# Patient Record
Sex: Male | Born: 1995 | Race: White | Hispanic: No | Marital: Single | Smoking: Never smoker
Health system: Southern US, Community
[De-identification: ages and names within clinical notes are randomized; demographics above are authoritative.]

---

## 2015-11-06 ENCOUNTER — Encounter: Payer: Self-pay | Admitting: Internal Medicine

## 2015-11-06 ENCOUNTER — Emergency Department: Payer: BLUE CROSS/BLUE SHIELD

## 2015-11-06 ENCOUNTER — Inpatient Hospital Stay: Payer: BLUE CROSS/BLUE SHIELD

## 2015-11-06 ENCOUNTER — Inpatient Hospital Stay
Admission: EM | Admit: 2015-11-06 | Discharge: 2015-11-09 | DRG: 917 | Disposition: A | Discharge status: Home / Self Care | Payer: BLUE CROSS/BLUE SHIELD | Attending: Internal Medicine | Admitting: Internal Medicine

## 2015-11-06 DIAGNOSIS — G92 Toxic encephalopathy: Secondary | ICD-10-CM | POA: Diagnosis present

## 2015-11-06 DIAGNOSIS — J9811 Atelectasis: Secondary | ICD-10-CM | POA: Diagnosis present

## 2015-11-06 DIAGNOSIS — J9600 Acute respiratory failure, unspecified whether with hypoxia or hypercapnia: Secondary | ICD-10-CM | POA: Diagnosis present

## 2015-11-06 DIAGNOSIS — R402432 Glasgow coma scale score 3-8, at arrival to emergency department: Secondary | ICD-10-CM

## 2015-11-06 DIAGNOSIS — Y929 Unspecified place or not applicable: Secondary | ICD-10-CM | POA: Diagnosis not present

## 2015-11-06 DIAGNOSIS — G312 Degeneration of nervous system due to alcohol: Secondary | ICD-10-CM | POA: Diagnosis present

## 2015-11-06 DIAGNOSIS — F10129 Alcohol abuse with intoxication, unspecified: Secondary | ICD-10-CM | POA: Diagnosis present

## 2015-11-06 DIAGNOSIS — T5191XA Toxic effect of unspecified alcohol, accidental (unintentional), initial encounter: Secondary | ICD-10-CM

## 2015-11-06 DIAGNOSIS — T510X1A Toxic effect of ethanol, accidental (unintentional), initial encounter: Principal | ICD-10-CM | POA: Diagnosis present

## 2015-11-06 DIAGNOSIS — R509 Fever, unspecified: Secondary | ICD-10-CM

## 2015-11-06 DIAGNOSIS — J9690 Respiratory failure, unspecified, unspecified whether with hypoxia or hypercapnia: Secondary | ICD-10-CM

## 2015-11-06 DIAGNOSIS — T5191XS Toxic effect of unspecified alcohol, accidental (unintentional), sequela: Secondary | ICD-10-CM | POA: Diagnosis not present

## 2015-11-06 DIAGNOSIS — R404 Transient alteration of awareness: Secondary | ICD-10-CM | POA: Diagnosis not present

## 2015-11-06 DIAGNOSIS — R4189 Other symptoms and signs involving cognitive functions and awareness: Secondary | ICD-10-CM

## 2015-11-06 LAB — URINE DRUG SCREEN, QUALITATIVE (ARMC ONLY)
AMPHETAMINES, UR SCREEN: NOT DETECTED
BENZODIAZEPINE, UR SCRN: NOT DETECTED
Barbiturates, Ur Screen: NOT DETECTED
CANNABINOID 50 NG, UR ~~LOC~~: NOT DETECTED
Cocaine Metabolite,Ur ~~LOC~~: NOT DETECTED
MDMA (Ecstasy)Ur Screen: NOT DETECTED
Methadone Scn, Ur: NOT DETECTED
OPIATE, UR SCREEN: NOT DETECTED
PHENCYCLIDINE (PCP) UR S: NOT DETECTED
Tricyclic, Ur Screen: NOT DETECTED

## 2015-11-06 LAB — CBC WITH DIFFERENTIAL/PLATELET
Basophils Absolute: 0 K/uL (ref 0–0.1)
Basophils Relative: 0 %
Eosinophils Absolute: 0.1 K/uL (ref 0–0.7)
Eosinophils Relative: 1 %
HCT: 54 % — ABNORMAL HIGH (ref 40.0–52.0)
Hemoglobin: 17.9 g/dL (ref 13.0–18.0)
Lymphocytes Relative: 25 %
Lymphs Abs: 2.3 K/uL (ref 1.0–3.6)
MCH: 30.6 pg (ref 26.0–34.0)
MCHC: 33.2 g/dL (ref 32.0–36.0)
MCV: 92.1 fL (ref 80.0–100.0)
Monocytes Absolute: 0.7 K/uL (ref 0.2–1.0)
Monocytes Relative: 7 %
Neutro Abs: 6.2 K/uL (ref 1.4–6.5)
Neutrophils Relative %: 67 %
Platelets: 273 K/uL (ref 150–440)
RBC: 5.86 MIL/uL (ref 4.40–5.90)
RDW: 12.5 % (ref 11.5–14.5)
WBC: 9.3 K/uL (ref 3.8–10.6)

## 2015-11-06 LAB — ACETAMINOPHEN LEVEL: Acetaminophen (Tylenol), Serum: <10 ug/mL — ABNORMAL LOW (ref 10–30)

## 2015-11-06 LAB — URINALYSIS COMPLETE WITH MICROSCOPIC (ARMC ONLY)
Bacteria, UA: NONE SEEN
Bilirubin Urine: NEGATIVE
Glucose, UA: NEGATIVE mg/dL
Hgb urine dipstick: NEGATIVE
Ketones, ur: NEGATIVE mg/dL
Leukocytes, UA: NEGATIVE
Nitrite: NEGATIVE
Protein, ur: NEGATIVE mg/dL
RBC / HPF: NONE SEEN RBC/hpf (ref 0–5)
Specific Gravity, Urine: 1.002 — ABNORMAL LOW (ref 1.005–1.030)
Squamous Epithelial / HPF: NONE SEEN
WBC, UA: NONE SEEN WBC/hpf (ref 0–5)
pH: 6 (ref 5.0–8.0)

## 2015-11-06 LAB — COMPREHENSIVE METABOLIC PANEL
ALBUMIN: 5.2 g/dL — AB (ref 3.5–5.0)
ALT: 27 U/L (ref 17–63)
AST: 25 U/L (ref 15–41)
Alkaline Phosphatase: 82 U/L (ref 38–126)
Anion gap: 9 (ref 5–15)
BILIRUBIN TOTAL: 0.6 mg/dL (ref 0.3–1.2)
BUN: 12 mg/dL (ref 6–20)
CALCIUM: 9 mg/dL (ref 8.9–10.3)
CO2: 28 mmol/L (ref 22–32)
Chloride: 104 mmol/L (ref 101–111)
Creatinine, Ser: 0.95 mg/dL (ref 0.61–1.24)
GFR calc Af Amer: >60 mL/min (ref >60)
GFR calc non Af Amer: >60 mL/min (ref >60)
GLUCOSE: 99 mg/dL (ref 65–99)
POTASSIUM: 3.5 mmol/L (ref 3.5–5.1)
SODIUM: 141 mmol/L (ref 135–145)
TOTAL PROTEIN: 8.7 g/dL — AB (ref 6.5–8.1)

## 2015-11-06 LAB — ETHANOL: Alcohol, Ethyl (B): 417 mg/dL (ref <5)

## 2015-11-06 LAB — LACTIC ACID, PLASMA
Lactic Acid, Venous: 2.4 mmol/L (ref 0.5–2.0)
Lactic Acid, Venous: 2.3 mmol/L (ref 0.5–2.0)

## 2015-11-06 LAB — SALICYLATE LEVEL

## 2015-11-06 LAB — MRSA PCR SCREENING: MRSA by PCR: NEGATIVE

## 2015-11-06 LAB — GLUCOSE, CAPILLARY: Glucose-Capillary: 119 mg/dL — ABNORMAL HIGH (ref 65–99)

## 2015-11-06 MED ORDER — SUCCINYLCHOLINE CHLORIDE 20 MG/ML IJ SOLN
INTRAMUSCULAR | Status: AC | PRN
  Administered 2015-11-06: 100 mg via INTRAVENOUS

## 2015-11-06 MED ORDER — ONDANSETRON HCL 4 MG PO TABS: 4.0000 mg | ORAL_TABLET | Freq: Four times a day (QID) | ORAL | Status: DC | PRN

## 2015-11-06 MED ORDER — ANTISEPTIC ORAL RINSE SOLUTION (CORINZ)
7.0000 mL | Freq: Four times a day (QID) | OROMUCOSAL | Status: DC
  Administered 2015-11-07 (×2): 7 mL via OROMUCOSAL
  Filled 2015-11-06 (×6): qty 7

## 2015-11-06 MED ORDER — CHLORHEXIDINE GLUCONATE 0.12% ORAL RINSE (MEDLINE KIT)
15.0000 mL | Freq: Two times a day (BID) | OROMUCOSAL | Status: DC
  Administered 2015-11-06 – 2015-11-07 (×2): 15 mL via OROMUCOSAL
  Filled 2015-11-06 (×4): qty 15

## 2015-11-06 MED ORDER — SODIUM CHLORIDE 0.9 % IJ SOLN
3.0000 mL | Freq: Two times a day (BID) | INTRAMUSCULAR | Status: DC
Start: 2015-11-06 — End: 2015-11-09
  Administered 2015-11-06 – 2015-11-07 (×3): 3 mL via INTRAVENOUS

## 2015-11-06 MED ORDER — ONDANSETRON HCL 4 MG/2ML IJ SOLN
4.0000 mg | Freq: Four times a day (QID) | INTRAMUSCULAR | Status: DC | PRN
Start: 2015-11-06 — End: 2015-11-09

## 2015-11-06 MED ORDER — PANTOPRAZOLE SODIUM 40 MG IV SOLR
40.0000 mg | Freq: Two times a day (BID) | INTRAVENOUS | Status: DC
  Administered 2015-11-06 – 2015-11-07 (×3): 40 mg via INTRAVENOUS
  Filled 2015-11-06 (×4): qty 40

## 2015-11-06 MED ORDER — LORAZEPAM 2 MG/ML IJ SOLN
1.0000 mg | INTRAMUSCULAR | Status: DC | PRN
  Administered 2015-11-06 – 2015-11-07 (×2): 1 mg via INTRAVENOUS
  Filled 2015-11-06 (×2): qty 1

## 2015-11-06 MED ORDER — DEXTROSE 5 % IV SOLN
1.0000 g | INTRAVENOUS | Status: DC
  Administered 2015-11-06: 1 g via INTRAVENOUS
  Filled 2015-11-06 (×2): qty 10

## 2015-11-06 MED ORDER — KCL IN DEXTROSE-NACL 20-5-0.45 MEQ/L-%-% IV SOLN
INTRAVENOUS | Status: DC
  Administered 2015-11-06 – 2015-11-07 (×2): via INTRAVENOUS
  Filled 2015-11-06 (×5): qty 1000

## 2015-11-06 MED ORDER — MORPHINE SULFATE (PF) 2 MG/ML IV SOLN: 2.0000 mg | INTRAVENOUS | Status: DC | PRN

## 2015-11-06 MED ORDER — LORAZEPAM 2 MG/ML IJ SOLN
INTRAMUSCULAR | Status: AC
  Filled 2015-11-06: qty 1

## 2015-11-06 MED ORDER — ETOMIDATE 2 MG/ML IV SOLN
INTRAVENOUS | Status: AC | PRN
  Administered 2015-11-06: 20 mg via INTRAVENOUS

## 2015-11-06 MED ORDER — MIDAZOLAM HCL 2 MG/2ML IJ SOLN
2.0000 mg | INTRAMUSCULAR | Status: DC | PRN
  Administered 2015-11-06: 2 mg via INTRAVENOUS
  Filled 2015-11-06: qty 2

## 2015-11-06 MED ORDER — HEPARIN SODIUM (PORCINE) 5000 UNIT/ML IJ SOLN
5000.0000 [IU] | Freq: Three times a day (TID) | INTRAMUSCULAR | Status: DC
  Administered 2015-11-06 – 2015-11-07 (×2): 5000 [IU] via SUBCUTANEOUS
  Filled 2015-11-06 (×2): qty 1

## 2015-11-06 NOTE — ED Notes (Signed)
Pt unresponsive and unable to give any info.

## 2015-11-06 NOTE — Progress Notes (Signed)
ETT advanced to 25cm. Pt tolerated fairly well. Coughing. Reinflated cuff and secured ETT to tube holder.

## 2015-11-06 NOTE — ED Provider Notes (Signed)
Time Seen: Approximately patient was seen on arrival  I have reviewed the triage notes  Chief Complaint: Ingestion   History of Present Illness: Richard Fleming is a 19 y.o. male who was transported here by EMS with a history of having an altered mental status. Limited history based on EMS and interviewing the bystanders at the scene. I have no bystanders here to interview during initial assessment. The patient arrives obtunded and quick bedside exam shows no gag reflex at this point he does not respond to any painful stimuli. Pupils are 4-2 mm bilateral reactive. The patient had received some Narcan prior to arrival. No significant change in his mental status. He noted that he had vomited on his face and nose and a flat on his back when they arrived.   History reviewed. No pertinent past medical history.  Patient Active Problem List   Diagnosis Date Noted  . Unresponsive state 11/06/2015  . Alcohol causing toxic effect 11/06/2015    History reviewed. No pertinent past surgical history.  History reviewed. No pertinent past surgical history.  Current Outpatient Rx  Name  Route  Sig  Dispense  Refill  . ACANYA gel   Topical   Apply 1 application topically every morning. Apply sparingly to face.      0     Dispense as written.   . Sulfacetamide Sodium, Acne, 10 % LOTN   Topical   Apply 1 application topically at bedtime. Apply to affected area on top of tazorac.      2   . TAZORAC 0.05 % cream   Topical   Apply 1 application topically every evening. Apply to affected area      0     Dispense as written.     Allergies:  Review of patient's allergies indicates not on file.  Family History: History reviewed. No pertinent family history.  Social History: Social History  Substance Use Topics  . Smoking status: Unknown If Ever Smoked  . Smokeless tobacco: None  . Alcohol Use: None     Review of Systems:  We're unable to obtain any review of  systems.  Physical Exam:  ED Triage Vitals  Enc Vitals Group     BP 11/06/15 1500 121/78 mmHg     Pulse Rate 11/06/15 1500 69     Resp 11/06/15 1500 12     Temp 11/06/15 1530 95.7 F (35.4 C)     Temp Source 11/06/15 1804 Core     SpO2 11/06/15 1500 99 %     Weight 11/06/15 1507 171 lb (77.565 kg)     Height 11/06/15 1507  (1.727 m)     Head Cir --      Peak Flow --      Pain Score --      Pain Loc --      Pain Edu? --      Excl. in GC? --     General: Patient is a Glasgow Coma Scale of 5 he is nonverbal and has downgoing Babinski with some mild retraction noted decorticate ordered the cerebral activity Head: Normal cephalic , atraumatic Eyes: Pupils equal , round, reactive to light Nose/Throat: No nasal drainage, patent upper airway without erythema or exudate. Patient has what appears to be some dried emesis at the nares and around his lower part of his mouth is no complaint of fluids in the oral cavity. With tongue depressor applied to the back of his throat he has a minimal  gag reflex Neck: Supple, Full range of motion, No anterior adenopathy or palpable thyroid masses Lungs: Clear to ascultation with diminished breath sounds at the right base without wheezes or rhonchi Heart: Regular rate, regular rhythm without murmurs , gallops , or rubs Abdomen: Soft, non tender without rebound, guarding , or rigidity; bowel sounds positive and symmetric in all 4 quadrants. No organomegaly .        Extremities: 2 plus symmetric pulses. No edema, clubbing or cyanosis Neurologic: GCS less than 8 Skin: warm, dry, no rashes mild diffuse erythematous rash that appears hive-like in nature. No edema   Labs:   All laboratory work was reviewed including any pertinent negatives or positives listed below:  Labs Reviewed  CBC WITH DIFFERENTIAL/PLATELET - Abnormal; Notable for the following:    HCT 54.0 (*)    All other components within normal limits  COMPREHENSIVE METABOLIC PANEL -  Abnormal; Notable for the following:    Total Protein 8.7 (*)    Albumin 5.2 (*)    All other components within normal limits  URINALYSIS COMPLETEWITH MICROSCOPIC ([facility] ONLY) - Abnormal; Notable for the following:    Color, Urine COLORLESS (*)    APPearance CLEAR (*)    Specific Gravity, Urine 1.002 (*)    All other components within normal limits  LACTIC ACID, PLASMA - Abnormal; Notable for the following:    Lactic Acid, Venous 2.4 (*)    All other components within normal limits  ETHANOL - Abnormal; Notable for the following:    Alcohol, Ethyl (B) 417 (*)    All other components within normal limits  ACETAMINOPHEN LEVEL - Abnormal; Notable for the following:    Acetaminophen (Tylenol), Serum <10 (*)    All other components within normal limits  BLOOD GAS, ARTERIAL - Abnormal; Notable for the following:    pO2, Arterial 162 (*)    Allens test (pass/fail) POSITIVE (*)    All other components within normal limits  URINE DRUG SCREEN, QUALITATIVE ([facility] ONLY)  SALICYLATE LEVEL  LACTIC ACID, PLASMA  ETHANOL    EKG:  ED ECG REPORT I, Jennye MoccasinBrian S Quigley, the attending physician, personally viewed and interpreted this ECG.  Date: 11/06/2015 EKG Time: 1458 Rate: 75 Rhythm: normal sinus rhythm QRS Axis: normal Intervals: normal ST/T Wave abnormalities: normal Conduction Disutrbances: none Narrative Interpretation: unremarkable    Radiology:  EXAM: CT HEAD WITHOUT CONTRAST  TECHNIQUE: Contiguous axial images were obtained from the base of the skull through the vertex without intravenous contrast.  COMPARISON: None.  FINDINGS: Ventricles and cisterns are within normal. There is no mass, shift of midline structures or acute hemorrhage. No evidence of acute infarction. 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 Bony structures are within normal.  IMPRESSION: No acute intracranial findings.   Electronically Signed By: Elberta Fortisaniel Boyle M.D. On: 11/06/2015  17:17          DG Chest Port 1 View (Final result) Result time: 11/06/15 16:12:07   Final result by Rad Results In Interface (11/06/15 16:12:07)   Narrative:   CLINICAL DATA: NG tube placement. Found unresponsive and warm room today.  EXAM: PORTABLE CHEST 1 VIEW  COMPARISON: None.  FINDINGS: Endotracheal tube with tip 7.7 cm above the carina. Enteric tube courses into the stomach with tip and side port within the stomach in the left upper quadrant.  Lungs are adequately inflated without consolidation or effusion. Cardiomediastinal silhouette is within normal. Remaining bones and soft tissues are normal.  IMPRESSION: No acute cardiopulmonary disease.  Tubes and lines as described.        I personally reviewed the radiologic studies   Procedures: * Rapid sequence intubation Patient required airway management due to the risk of aspiration with significant altered mental status with a Glasgow Coma Scale 5. After IV was obtained patient was placed on a continuous cardiac monitor. Patient received etomidate followed by succinylcholine with rapid intubation. His pulse oximetry at the low struck to the high 80s and I was prior to airway management. Patient appeared to tolerate the procedure well and was intubated second pass with a 7.5 endotracheal tube. With positive exchange. Breath sounds are auscultated bilaterally and seemed to be symmetric. X-ray following intubation and shows that the endotracheal tube is approximately 7 cm above the carina.   Critical Care:  CRITICAL CARE Performed by: Jennye Moccasin   Total critical care time: 53 minutes  Critical care time was exclusive of separately billable procedures and treating other patients.  Critical care was necessary to treat or prevent imminent or life-threatening deterioration.  Critical care was time spent personally by me on the following activities: development of treatment plan with patient and/or  surrogate as well as nursing, discussions with consultants, evaluation of patient's response to treatment, examination of patient, obtaining history from patient or surrogate, ordering and performing treatments and interventions, ordering and review of laboratory studies, ordering and review of radiographic studies, pulse oximetry and re-evaluation of patient's condition. Patient required numerous assessments along with workup for altered mental status with acute airway management etc.    ED Course: * Patient was started on workup for altered mental status with an extreme large differential. Given his age most likely some form of toxic ingestion. Given his history the patient has had an altered mental status for an unknown period of time. Patient on arrival was minimally responsive with a Glasgow Coma Scale of 5 downgoing Babinski. Patient after an extensive workup including head CT toxic screen electrolytes arterial blood gas etc. appears to have severe alcohol toxicity. Patient has had updates with the family and also with a cousin who visited here later.  The patient's head CT was negative at this time and his altered mental status is suspected to be extremely high level of alcohol toxicity if he was greater than 400 here on arrival and possibly several hours since he last ingested alcohol based on a very limited history. The patient had nasogastric and Foley catheter inserted after intubation   Final Clinical Impression: Acute alcohol toxicity Altered mental status Final diagnoses:  Glasgow coma scale total score 3-8, at arrival to emergency department [facility])     Plan:  Admission intensive care unit           Jennye Moccasin, MD 11/06/15 [phone]

## 2015-11-06 NOTE — Progress Notes (Signed)
eLink Physician-Brief Progress Note Patient Name: Richard MartinezBrendan Francis Delmundo DOB: 07/22/1996 MRN: [id number]   Date of Service  11/06/2015  HPI/Events of Note  Agitation/ possible ET dislodged   eICU Interventions  Use ativan/versed/ MS already ordered and if not controlled change to versed drip and check cxr for et placement      Intervention Category Major Interventions: Respiratory failure - evaluation and management  Sandrea HughsMichael Ember Henrikson 11/06/2015, 7:14 PM

## 2015-11-06 NOTE — ED Notes (Signed)
Dropped off at dorm at 10am after night of drinking and possible drugs. His friends called ems when pt wouldn't wake up. Given narcan for snoring resps - vomitus on face and nose, was flat on back. Pupils pinpoint and pt unresponsive with weak gag reflex.

## 2015-11-06 NOTE — Plan of Care (Signed)
Problem: Phase I Progression Outcomes Goal: VTE prophylaxis Outcome: Completed/Met Date Met:  11/06/15 TED hose bilaterally and subQ heparin Goal: Voiding-avoid urinary catheter unless indicated Outcome: Not Progressing Foley in place for critical patient and strict I&O

## 2015-11-06 NOTE — Progress Notes (Signed)
eLink Physician-Brief Progress Note Patient Name: Richard MartinezBrendan Francis Fleming DOB: 10/17/1996 MRN: [id number]   Date of Service  11/06/2015  HPI/Events of Note  ET is too high on f/u cxr   eICU Interventions  Advance by 4 cm      Intervention Category Major Interventions: Respiratory failure - evaluation and management  Sandrea HughsMichael Kaenan Jake 11/06/2015, 8:30 PM

## 2015-11-06 NOTE — H&P (Signed)
History and Physical    Richard MartinezBrendan Francis Aldava ZOX:[id number] DOB: 02/07/1996 DOA: 11/06/2015  Referring physician: Dr. Huel CoteQuigley PCP: No primary care provider on file.  Specialists: none  Chief Complaint: unresponsive  HPI: Richard MartinezBrendan Francis Fleming is a 19 y.o. male has a past medical history significant for acne brought to ER after being found unresponsive with vomitus on his face. In ER, pt was intubated to protect his airway. Responsive only to pain. ETOH level toxic. He is now admitted.  Review of Systems: unable to obtain  History reviewed. No pertinent past medical history. History reviewed. No pertinent past surgical history. Social History:  has no tobacco, alcohol, and drug history on file.  Not on File  History reviewed. No pertinent family history.  Prior to Admission medications   Medication Sig Start Date End Date Taking? Authorizing Provider  ACANYA gel Apply 1 application topically every morning. Apply sparingly to face. 09/22/15  Yes Historical Provider, MD  Sulfacetamide Sodium, Acne, 10 % LOTN Apply 1 application topically at bedtime. Apply to affected area on top of tazorac. 10/10/15  Yes Historical Provider, MD  TAZORAC 0.05 % cream Apply 1 application topically every evening. Apply to affected area 10/11/15  Yes Historical Provider, MD   Physical Exam: Filed Vitals:   11/06/15 1547 11/06/15 1600 11/06/15 1630 11/06/15 1700  BP: 139/79 149/94 119/66 106/57  Pulse: 104 104 78 70  Temp:  95.8 F (35.4 C) 97.2 F (36.2 C) 97.7 F (36.5 C)  Resp: 27 13 26  0  Height:      Weight:      SpO2: 100% 98% 100% 100%     General:  Critically ill appearing, intubated  Eyes: pupils minimally reactive,  no scleral icterus  ENT: moist oropharynx  Neck: supple, no lymphadenopathy  Cardiovascular: regular rate without MRG; 2+ peripheral pulses, no JVD, no peripheral edema  Respiratory: CTA biL, good air movement with scattered rhonchi  Abdomen: soft, non tender to  palpation, positive bowel sounds, no guarding, no rebound  Skin: no rashes  Musculoskeletal: normal bulk and tone, no joint swelling  Psychiatric: unable to assess  Neurologic: responds to pain  Labs on Admission:  Basic Metabolic Panel:  Recent Labs Lab 11/06/15 1510  NA 141  K 3.5  CL 104  CO2 28  GLUCOSE 99  BUN 12  CREATININE 0.95  CALCIUM 9.0   Liver Function Tests:  Recent Labs Lab 11/06/15 1510  AST 25  ALT 27  ALKPHOS 82  BILITOT 0.6  PROT 8.7*  ALBUMIN 5.2*   No results for input(s): LIPASE, AMYLASE in the last 168 hours. No results for input(s): AMMONIA in the last 168 hours. CBC:  Recent Labs Lab 11/06/15 1510  WBC 9.3  NEUTROABS 6.2  HGB 17.9  HCT 54.0*  MCV 92.1  PLT 273   Cardiac Enzymes: No results for input(s): CKTOTAL, CKMB, CKMBINDEX, TROPONINI in the last 168 hours.  BNP (last 3 results) No results for input(s): BNP in the last 8760 hours.  ProBNP (last 3 results) No results for input(s): PROBNP in the last 8760 hours.  CBG: No results for input(s): GLUCAP in the last 168 hours.  Radiological Exams on Admission: Ct Head Wo Contrast  11/06/2015  CLINICAL DATA:  Dropped off at dorm this morning after night of alcohol use and possible drugs. Given narcan. Pinpoint pupils as patient unresponsive and week gag reflex. EXAM: CT HEAD WITHOUT CONTRAST TECHNIQUE: Contiguous axial images were obtained from the base of the skull  through the vertex without intravenous contrast. COMPARISON:  None. FINDINGS: Ventricles and cisterns are within normal. There is no mass, shift of midline structures or acute hemorrhage. No evidence of acute infarction. 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 Bony structures are within normal. IMPRESSION: No acute intracranial findings. Electronically Signed   By: Elberta Fortis M.D.   On: 11/06/2015 17:17   Dg Chest Port 1 View  11/06/2015  CLINICAL DATA:  NG tube placement. Found unresponsive and  warm room today. EXAM: PORTABLE CHEST 1 VIEW COMPARISON:  None. FINDINGS: Endotracheal tube with tip 7.7 cm above the carina. Enteric tube courses into the stomach with tip and side port within the stomach in the left upper quadrant. Lungs are adequately inflated without consolidation or effusion. Cardiomediastinal silhouette is within normal. Remaining bones and soft tissues are normal. IMPRESSION: No acute cardiopulmonary disease. Tubes and lines as described. Electronically Signed   By: Elberta Fortis M.D.   On: 11/06/2015 16:12    EKG: Independently reviewed.  Assessment/Plan Principal Problem:   Unresponsive state Active Problems:   Alcohol causing toxic effect   Will admit to ICU with IV fluids and empiric IV ABX. Consult Neurology and Pulmonology. Repeat labs and CXR in AM.  Diet: NPO Fluids: D5 1/2NS with K+@125  DVT Prophylaxis: SQ Heparin  Code Status: FULL  Family Communication: per ER MD  Disposition Plan: home  Time spent: 50 min

## 2015-11-07 ENCOUNTER — Inpatient Hospital Stay: Payer: BLUE CROSS/BLUE SHIELD

## 2015-11-07 ENCOUNTER — Encounter: Payer: Self-pay | Admitting: *Deleted

## 2015-11-07 DIAGNOSIS — R404 Transient alteration of awareness: Secondary | ICD-10-CM

## 2015-11-07 DIAGNOSIS — J9600 Acute respiratory failure, unspecified whether with hypoxia or hypercapnia: Secondary | ICD-10-CM

## 2015-11-07 DIAGNOSIS — T5191XS Toxic effect of unspecified alcohol, accidental (unintentional), sequela: Secondary | ICD-10-CM

## 2015-11-07 LAB — COMPREHENSIVE METABOLIC PANEL
ALK PHOS: 69 U/L (ref 38–126)
ALT: 23 U/L (ref 17–63)
AST: 22 U/L (ref 15–41)
Albumin: 4.4 g/dL (ref 3.5–5.0)
Anion gap: 8 (ref 5–15)
BILIRUBIN TOTAL: 0.9 mg/dL (ref 0.3–1.2)
BUN: 12 mg/dL (ref 6–20)
CALCIUM: 8.6 mg/dL — AB (ref 8.9–10.3)
CHLORIDE: 112 mmol/L — AB (ref 101–111)
CO2: 24 mmol/L (ref 22–32)
CREATININE: 0.75 mg/dL (ref 0.61–1.24)
Glucose, Bld: 136 mg/dL — ABNORMAL HIGH (ref 65–99)
Potassium: 3.5 mmol/L (ref 3.5–5.1)
Sodium: 144 mmol/L (ref 135–145)
Total Protein: 7.5 g/dL (ref 6.5–8.1)

## 2015-11-07 LAB — URINALYSIS COMPLETE WITH MICROSCOPIC (ARMC ONLY)
BILIRUBIN URINE: NEGATIVE
Bacteria, UA: NONE SEEN
GLUCOSE, UA: NEGATIVE mg/dL
HGB URINE DIPSTICK: NEGATIVE
KETONES UR: NEGATIVE mg/dL
LEUKOCYTES UA: NEGATIVE
NITRITE: NEGATIVE
PH: 7 (ref 5.0–8.0)
Protein, ur: NEGATIVE mg/dL
RBC / HPF: NONE SEEN RBC/hpf (ref 0–5)
SPECIFIC GRAVITY, URINE: 1.02 (ref 1.005–1.030)
Squamous Epithelial / LPF: NONE SEEN

## 2015-11-07 LAB — CBC
HEMATOCRIT: 48.5 % (ref 40.0–52.0)
HEMOGLOBIN: 16.5 g/dL (ref 13.0–18.0)
MCH: 31.5 pg (ref 26.0–34.0)
MCHC: 33.9 g/dL (ref 32.0–36.0)
MCV: 92.9 fL (ref 80.0–100.0)
PLATELETS: 265 10*3/uL (ref 150–440)
RBC: 5.22 MIL/uL (ref 4.40–5.90)
RDW: 12.4 % (ref 11.5–14.5)
WBC: 22.2 10*3/uL — AB (ref 3.8–10.6)

## 2015-11-07 LAB — BLOOD GAS, ARTERIAL
ACID-BASE DEFICIT: 1.3 mmol/L (ref 0.0–2.0)
Allens test (pass/fail): POSITIVE — AB
BICARBONATE: 23.7 meq/L (ref 21.0–28.0)
FIO2: 0.5
LHR: 20 {breaths}/min
O2 SAT: 99.4 %
PEEP: 5 cmH2O
PH ART: 7.38 (ref 7.350–7.450)
Patient temperature: 37
VT: 500 mL
pCO2 arterial: 40 mmHg (ref 32.0–48.0)
pO2, Arterial: 162 mmHg — ABNORMAL HIGH (ref 83.0–108.0)

## 2015-11-07 LAB — RAPID HIV SCREEN (HIV 1/2 AB+AG)
HIV 1/2 Antibodies: NONREACTIVE
HIV-1 P24 ANTIGEN - HIV24: NONREACTIVE

## 2015-11-07 LAB — PROTIME-INR
INR: 1.14
Prothrombin Time: 14.8 seconds (ref 11.4–15.0)

## 2015-11-07 LAB — ETHANOL: ALCOHOL ETHYL (B): 146 mg/dL — AB (ref <5)

## 2015-11-07 MED ORDER — VANCOMYCIN HCL IN DEXTROSE 1-5 GM/200ML-% IV SOLN
1000.0000 mg | Freq: Once | INTRAVENOUS | Status: AC
  Administered 2015-11-07: 1000 mg via INTRAVENOUS
  Filled 2015-11-07: qty 200

## 2015-11-07 MED ORDER — DEXTROSE 5 % IV SOLN
1.0000 g | INTRAVENOUS | Status: DC
  Filled 2015-11-07: qty 10

## 2015-11-07 MED ORDER — VANCOMYCIN HCL IN DEXTROSE 1-5 GM/200ML-% IV SOLN
1000.0000 mg | Freq: Three times a day (TID) | INTRAVENOUS | Status: DC
  Administered 2015-11-07 – 2015-11-09 (×5): 1000 mg via INTRAVENOUS
  Filled 2015-11-07 (×7): qty 200

## 2015-11-07 MED ORDER — AMPICILLIN-SULBACTAM SODIUM 1.5 (1-0.5) G IJ SOLR
1.5000 g | Freq: Three times a day (TID) | INTRAMUSCULAR | Status: DC
  Filled 2015-11-07 (×2): qty 1.5

## 2015-11-07 MED ORDER — PIPERACILLIN-TAZOBACTAM 3.375 G IVPB
3.3750 g | Freq: Three times a day (TID) | INTRAVENOUS | Status: DC
  Administered 2015-11-07 – 2015-11-09 (×5): 3.375 g via INTRAVENOUS
  Filled 2015-11-07 (×7): qty 50

## 2015-11-07 MED ORDER — VANCOMYCIN HCL IN DEXTROSE 1-5 GM/200ML-% IV SOLN
1000.0000 mg | Freq: Three times a day (TID) | INTRAVENOUS | Status: DC
  Administered 2015-11-07: 1000 mg via INTRAVENOUS
  Filled 2015-11-07 (×3): qty 200

## 2015-11-07 MED ORDER — ACETAMINOPHEN 160 MG/5ML PO SOLN
650.0000 mg | Freq: Four times a day (QID) | ORAL | Status: DC | PRN
  Administered 2015-11-07: 650 mg via ORAL
  Filled 2015-11-07: qty 20.3

## 2015-11-07 MED ORDER — ACETAMINOPHEN 160 MG/5ML PO SOLN
650.0000 mg | Freq: Four times a day (QID) | ORAL | Status: DC | PRN
  Administered 2015-11-07 (×2): 650 mg
  Filled 2015-11-07 (×2): qty 20.3

## 2015-11-07 NOTE — Consult Note (Signed)
[facility] Consultation      Name: Richard Fleming MRN: [id number] DOB: 06-28-1996    ADMISSION DATE:  11/06/2015  CHIEF COMPLAINT:   Acute resp failure   HISTORY OF PRESENT ILLNESS  19 yo white male admitted to ICU for acute resp failure from acute encephalopathy from acute ETOH toxicity Patient intubated,sedated. Mother at bedside      PAST MEDICAL HISTORY    :  History reviewed. No pertinent past medical history. History reviewed. No pertinent past surgical history. Prior to Admission medications   Medication Sig Start Date End Date Taking? Authorizing Provider  ACANYA gel Apply 1 application topically every morning. Apply sparingly to face. 09/22/15  Yes Historical Provider, MD  Sulfacetamide Sodium, Acne, 10 % LOTN Apply 1 application topically at bedtime. Apply to affected area on top of tazorac. 10/10/15  Yes Historical Provider, MD  TAZORAC 0.05 % cream Apply 1 application topically every evening. Apply to affected area 10/11/15  Yes Historical Provider, MD   Not on File   FAMILY HISTORY   History reviewed. No pertinent family history.    SOCIAL HISTORY    reports that he has never smoked. He has never used smokeless tobacco. His alcohol and drug histories are not on file.  Review of Systems  Unable to perform ROS: critical illness      VITAL SIGNS    Temp:  [95.7 F (35.4 C)-102.4 F (39.1 C)] 100.9 F (38.3 C) (11/14 0700) Pulse Rate:  [64-131] 107 (11/14 0700) Resp:  [0-27] 20 (11/14 0700) BP: (100-149)/(51-94) 123/79 mmHg (11/14 0700) SpO2:  [97 %-100 %] 99 % (11/14 0700) FiO2 (%):  [30 %-50 %] 30 % (11/14 0331) Weight:  [164 lb 10.9 oz (74.7 kg)-171 lb (77.565 kg)] 166 lb 14.2 oz (75.7 kg) (11/14 0450) HEMODYNAMICS:   VENTILATOR SETTINGS: Vent Mode:  [-] PRVC FiO2 (%):  [30 %-50 %] 30 % Set Rate:  [20 bmp] 20 bmp Vt Set:  [500 mL] 500 mL PEEP:  [5 cmH20] 5 cmH20 INTAKE / OUTPUT:  Intake/Output Summary  (Last 24 hours) at 11/07/15 0910 Last data filed at 11/07/15 0700  Gross per 24 hour  Intake   2055 ml  Output   1260 ml  Net    795 ml       PHYSICAL EXAM   Physical Exam  Constitutional: He appears well-developed and well-nourished. No distress.  HENT:  Head: Normocephalic and atraumatic.  Eyes: Pupils are equal, round, and reactive to light. No scleral icterus.  Neck: Normal range of motion. Neck supple.  Cardiovascular: Normal rate and regular rhythm.   No murmur heard. Pulmonary/Chest: No respiratory distress. He has no wheezes. He has rales.  resp distress  Abdominal: Soft. He exhibits no distension. There is no tenderness.  Musculoskeletal: He exhibits no edema.  Neurological:  gcs<8T  Skin: Skin is warm. No rash noted. He is not diaphoretic.       LABS   LABS:  CBC  Recent Labs Lab 11/06/15 1510 11/07/15 0452  WBC 9.3 22.2*  HGB 17.9 16.5  HCT 54.0* 48.5  PLT 273 265   Coag's  Recent Labs Lab 11/07/15 0452  INR 1.14   BMET  Recent Labs Lab 11/06/15 1510 11/07/15 0452  NA 141 144  K 3.5 3.5  CL 104 112*  CO2 28 24  BUN 12 12  CREATININE 0.95 0.75  GLUCOSE 99 136*   Electrolytes  Recent Labs Lab 11/06/15 1510 11/07/15 0452  CALCIUM  9.0 8.6*   Sepsis Markers  Recent Labs Lab 11/06/15 1510 11/06/15 1916  LATICACIDVEN 2.4* 2.3*   ABG  Recent Labs Lab 11/06/15 1720  PHART 7.38  PCO2ART 40  PO2ART 162*   Liver Enzymes  Recent Labs Lab 11/06/15 1510 11/07/15 0452  AST 25 22  ALT 27 23  ALKPHOS 82 69  BILITOT 0.6 0.9  ALBUMIN 5.2* 4.4   Cardiac Enzymes No results for input(s): TROPONINI, PROBNP in the last 168 hours. Glucose  Recent Labs Lab 11/06/15 1902  GLUCAP 119*     Recent Results (from the past 240 hour(s))  MRSA PCR Screening     Status: None   Collection Time: 11/06/15  2:54 PM  Result Value Ref Range Status   MRSA by PCR NEGATIVE NEGATIVE Final    Comment:        The GeneXpert MRSA  Assay (FDA approved for NASAL specimens only), is one component of a comprehensive MRSA colonization surveillance program. It is not intended to diagnose MRSA infection nor to guide or monitor treatment for MRSA infections.      Current facility-administered medications:  .  acetaminophen (TYLENOL) solution 650 mg, 650 mg, Per Tube, Q6H PRN, Nelda Bucksaniel J Feinstein, MD, 650 mg at 11/07/15 [phone] .  antiseptic oral rinse solution (CORINZ), 7 mL, Mouth Rinse, QID, Sital Mody, MD, 7 mL at 11/07/15 0401 .  cefTRIAXone (ROCEPHIN) 1 g in dextrose 5 % 50 mL IVPB, 1 g, Intravenous, Q24H, Marguarite ArbourJeffrey D Sparks, MD, Stopped at 11/06/15 1824 .  chlorhexidine gluconate (PERIDEX) 0.12 % solution 15 mL, 15 mL, Mouth Rinse, BID, Sital Mody, MD, 15 mL at 11/07/15 0843 .  dextrose 5 % and 0.45 % NaCl with KCl 20 mEq/L infusion, , Intravenous, Continuous, Marguarite ArbourJeffrey D Sparks, MD, Last Rate: 125 mL/hr at 11/07/15 0300 .  heparin injection 5,000 Units, 5,000 Units, Subcutaneous, 3 times per day, Marguarite ArbourJeffrey D Sparks, MD, 5,000 Units at 11/07/15 [phone] .  LORazepam (ATIVAN) injection 1 mg, 1 mg, Intravenous, Q4H PRN, Marguarite ArbourJeffrey D Sparks, MD, 1 mg at 11/07/15 0113 .  midazolam (VERSED) injection 2 mg, 2 mg, Intravenous, Q2H PRN, Marguarite ArbourJeffrey D Sparks, MD, 2 mg at 11/06/15 2000 .  morphine 2 MG/ML injection 2 mg, 2 mg, Intravenous, Q2H PRN, Marguarite ArbourJeffrey D Sparks, MD .  ondansetron [facility](ZOFRAN) tablet 4 mg, 4 mg, Oral, Q6H PRN **OR** ondansetron (ZOFRAN) injection 4 mg, 4 mg, Intravenous, Q6H PRN, Marguarite ArbourJeffrey D Sparks, MD .  pantoprazole (PROTONIX) injection 40 mg, 40 mg, Intravenous, Q12H, Marguarite ArbourJeffrey D Sparks, MD, 40 mg at 11/06/15 2220 .  sodium chloride 0.9 % injection 3 mL, 3 mL, Intravenous, Q12H, Marguarite ArbourJeffrey D Sparks, MD, 3 mL at 11/06/15 2221 .  vancomycin (VANCOCIN) IVPB 1000 mg/200 mL premix, 1,000 mg, Intravenous, Q8H, Nelda Bucksaniel J Feinstein, MD, 1,000 mg at 11/07/15 [phone]  IMAGING    Dg Chest 1 View  11/06/2015  CLINICAL DATA:  Patient found  unresponsive. Assess endotracheal tube positioning. Initial encounter. EXAM: CHEST 1 VIEW COMPARISON:  Chest radiograph performed earlier today at 4:02 p.m. FINDINGS: The patient's endotracheal tube is seen ending 10 cm above the carina. This should be advanced 7 cm. The enteric tube is noted extending below the diaphragm. The lungs are well-aerated and clear. There is no evidence of focal opacification, pleural effusion or pneumothorax. The cardiomediastinal silhouette is within normal limits. No acute osseous abnormalities are seen. IMPRESSION: 1. Endotracheal tube seen ending 10 cm above the carina. This should be advanced 7 cm. 2. No  acute cardiopulmonary process seen. Electronically Signed   By: Roanna Raider M.D.   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 Given narcan. Pinpoint pupils as patient unresponsive and week gag reflex. EXAM: CT HEAD WITHOUT CONTRAST TECHNIQUE: Contiguous axial images were obtained from the base of the skull through the vertex without intravenous contrast. COMPARISON:  None. FINDINGS: Ventricles and cisterns are within normal. There is no mass, shift of midline structures or acute hemorrhage. No evidence of acute infarction. 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 Bony structures are within normal. IMPRESSION: No acute intracranial findings. Electronically Signed   By: Elberta Fortis M.D.   On: 11/06/2015 17:17   Portable Chest 1 View  11/07/2015  CLINICAL DATA:  Respiratory failure EXAM: PORTABLE CHEST 1 VIEW COMPARISON:  Portable chest x-ray of November 06, 2015 FINDINGS: The lungs are well-expanded. Patchy interstitial infiltrate persists in the right lower lobe. There is no pleural effusion or pneumothorax. The heart and pulmonary vascularity are normal. The endotracheal tube tip projects 3.6 cm above the carina. The esophagogastric tube tip projects  below the inferior margin of the image. IMPRESSION: Left lower lobe subsegmental atelectasis and more conspicuous today. The examination is otherwise unremarkable. The endotracheal tube has been advanced since yesterday's study. Electronically Signed   By: David  [place] M.D.   On: 11/07/2015 07:47   Dg Chest Port 1 View  11/06/2015  CLINICAL DATA:  NG tube placement. Found unresponsive and warm room today. EXAM: PORTABLE CHEST 1 VIEW COMPARISON:  None. FINDINGS: Endotracheal tube with tip 7.7 cm above the carina. Enteric tube courses into the stomach with tip and side port within the stomach in the left upper quadrant. Lungs are adequately inflated without consolidation or effusion. Cardiomediastinal silhouette is within normal. Remaining bones and soft tissues are normal. IMPRESSION: No acute cardiopulmonary disease. Tubes and lines as described. Electronically Signed   By: Elberta Fortis M.D.   On: 11/06/2015 16:12      Indwelling Urinary Catheter continued, requirement due to   Reason to continue Indwelling Urinary Catheter for strict Intake/Output monitoring for hemodynamic instability         Ventilator continued, requirement due to, resp failure    Ventilator Sedation RASS 0 to -2    MICRO DATA: MRSA PCR  Urine  Blood Resp   ANTIMICROBIALS:  Vanc/rocephin 11/14    ASSESSMENT/PLAN   19 yo white male with acute ETOH toxicity with acute resp failure and acute encephalopathy  Plan for trial of extubation today.    I have personally obtained a history, examined the patient, evaluated laboratory and independently reviewed  imaging results, formulated the assessment and plan and placed orders.  The Patient requires high complexity decision making for assessment and support, frequent evaluation and titration of therapies, application of advanced monitoring technologies and extensive interpretation of multiple databases. Critical Care Time devoted to patient care services  described in this note is 40 minutes.      Lucie Leather, M.D.  [facility]  Medical Director [facility] Medical Director [facility] Cardio-Pulmonary Department

## 2015-11-07 NOTE — Progress Notes (Signed)
eLink Physician-Brief Progress Note Patient Name: Richard MartinezBrendan Francis Brickle DOB: 08/05/1996 MRN: [id number]   Date of Service  11/07/2015  HPI/Events of Note  High fever spike  eICU Interventions  Add vanc Send sputum, hiv, BC tylenal     Intervention Category Intermediate Interventions: Infection - evaluation and management  Coco Sharpnack J. 11/07/2015, 12:10 AM

## 2015-11-07 NOTE — Progress Notes (Signed)
Patient's maintaining O2 sats 99-100% on 2 liter nasal cannula. Oxygen turned off and he is 96% on room air.  Patient requesting something to drink and I gave him water and he took slow sips and tolerated, he maintained his O2 sats and did not cough.

## 2015-11-07 NOTE — Progress Notes (Signed)
Patient and order verified.  Extubated patient per MD order to 2lpm nasal cannula.  Patient tolerating well at this time. Rn present at bedside.

## 2015-11-07 NOTE — Progress Notes (Signed)
Pharmacy Antibiotic Time-Out Note  Richard MartinezBrendan Francis Fleming is a 19 y.o. year-old male admitted on 11/06/2015.  The patient is currently on ceftriaxone and vancomycin  for possible PNA.  Assessment/Plan: Discussed antibiotics in rounds and MD ok with d/c abx due to low probability of infection.    Recent Labs Lab 11/06/15 1510 11/07/15 0452  WBC 9.3 22.2*    Recent Labs Lab 11/06/15 1510 11/07/15 0452  CREATININE 0.95 0.75   Estimated Creatinine Clearance: 159 mL/min (by C-G formula based on Cr of 0.75). Tmax/24h 102.4  Antimicrobial allergies: Unkown  Antimicrobials this admission: Ceftriaxone 11/13 >> 11/14 Vancomycin 11/14 >> 11/14  Levels/dose changes this admission: None  Microbiology Results: 11/14 BCx: NTD x 2 11/14 Sputum: pending  11/13 MRSA PCR: negative  .   Thank you for allowing pharmacy to be a part of this patient's care.  Luisa HartChristy, Delma Villalva D PharmD 11/07/2015 1:09 PM

## 2015-11-07 NOTE — Consult Note (Signed)
CC: AMS  HPI: Richard MartinezBrendan Francis Friedhoff is an 19 y.o. male has a past medical history significant for acne brought to ER after being found unresponsive with vomitus on his face. Pt was intoxicated.   He is s/p extubation and following commands.   History reviewed. No pertinent past medical history.  History reviewed. No pertinent past surgical history.  History reviewed. No pertinent family history.  Social History:  reports that he has never smoked. He has never used smokeless tobacco. His alcohol and drug histories are not on file.  Not on File  Medications: I have reviewed the patient's current medications.  ROS: History obtained from the patient  General ROS: negative for - chills, fatigue, fever, night sweats, weight gain or weight loss Psychological ROS: negative for - behavioral disorder, hallucinations, memory difficulties, mood swings or suicidal ideation Ophthalmic ROS: negative for - blurry vision, double vision, eye pain or loss of vision ENT ROS: negative for - epistaxis, nasal discharge, oral lesions, sore throat, tinnitus or vertigo Allergy and Immunology ROS: negative for - hives or itchy/watery eyes Hematological and Lymphatic ROS: negative for - bleeding problems, bruising or swollen lymph nodes Endocrine ROS: negative for - galactorrhea, hair pattern changes, polydipsia/polyuria or temperature intolerance Respiratory ROS: negative for - cough, hemoptysis, shortness of breath or wheezing Cardiovascular ROS: negative for - chest pain, dyspnea on exertion, edema or irregular heartbeat Gastrointestinal ROS: negative for - abdominal pain, diarrhea, hematemesis, nausea/vomiting or stool incontinence Genito-Urinary ROS: negative for - dysuria, hematuria, incontinence or urinary frequency/urgency Musculoskeletal ROS: negative for - joint swelling or muscular weakness Neurological ROS: as noted in HPI Dermatological ROS: negative for rash and skin lesion changes  Physical  Examination: Blood pressure 123/51, pulse 114, temperature 99.5 F (37.5 C), temperature source Oral, resp. rate 18, height 6' (1.829 m), weight 166 lb 14.2 oz (75.7 kg), SpO2 98 %.    Neurological Examination Mental Status: Alert, oriented, thought content appropriate.  Speech fluent without evidence of aphasia.  Able to follow 3 step commands without difficulty. Cranial Nerves: II: Discs flat bilaterally; Visual fields grossly normal, pupils equal, round, reactive to light and accommodation III,IV, VI: ptosis not present, extra-ocular motions intact bilaterally V,VII: smile symmetric, facial light touch sensation normal bilaterally VIII: hearing normal bilaterally IX,X: gag reflex present XI: bilateral shoulder shrug XII: midline tongue extension Motor: Right : Upper extremity   5/5    Left:     Upper extremity   5/5  Lower extremity   5/5     Lower extremity   5/5 Tone and bulk:normal tone throughout; no atrophy noted Sensory: Pinprick and light touch intact throughout, bilaterally Deep Tendon Reflexes: 2+ and symmetric throughout Plantars: Right: downgoing   Left: downgoing Cerebellar: normal finger-to-nose, normal rapid alternating movements and normal heel-to-shin test Gait: normal gait and station      Laboratory Studies:   Basic Metabolic Panel:  Recent Labs Lab 11/06/15 1510 11/07/15 0452  NA 141 144  K 3.5 3.5  CL 104 112*  CO2 28 24  GLUCOSE 99 136*  BUN 12 12  CREATININE 0.95 0.75  CALCIUM 9.0 8.6*    Liver Function Tests:  Recent Labs Lab 11/06/15 1510 11/07/15 0452  AST 25 22  ALT 27 23  ALKPHOS 82 69  BILITOT 0.6 0.9  PROT 8.7* 7.5  ALBUMIN 5.2* 4.4   No results for input(s): LIPASE, AMYLASE in the last 168 hours. No results for input(s): AMMONIA in the last 168 hours.  CBC:  Recent  Labs Lab 11/06/15 1510 11/07/15 0452  WBC 9.3 22.2*  NEUTROABS 6.2  --   HGB 17.9 16.5  HCT 54.0* 48.5  MCV 92.1 92.9  PLT 273 265    Cardiac  Enzymes: No results for input(s): CKTOTAL, CKMB, CKMBINDEX, TROPONINI in the last 168 hours.  BNP: Invalid input(s): POCBNP  CBG:  Recent Labs Lab 11/06/15 1902  GLUCAP 119*    Microbiology: Results for orders placed or performed during the hospital encounter of 11/06/15  MRSA PCR Screening     Status: None   Collection Time: 11/06/15  2:54 PM  Result Value Ref Range Status   MRSA by PCR NEGATIVE NEGATIVE Final    Comment:        The GeneXpert MRSA Assay (FDA approved for NASAL specimens only), is one component of a comprehensive MRSA colonization surveillance program. It is not intended to diagnose MRSA infection nor to guide or monitor treatment for MRSA infections.   Culture, blood (routine x 2)     Status: None (Preliminary result)   Collection Time: 11/07/15 12:19 AM  Result Value Ref Range Status   Specimen Description BLOOD LEFT HAND  Final   Special Requests BOTTLES DRAWN AEROBIC AND ANAEROBIC 6CC  Final   Culture NO GROWTH < 12 HOURS  Final   Report Status PENDING  Incomplete  Culture, blood (routine x 2)     Status: None (Preliminary result)   Collection Time: 11/07/15 12:39 AM  Result Value Ref Range Status   Specimen Description BLOOD LEFT HAND  Final   Special Requests BOTTLES DRAWN AEROBIC AND ANAEROBIC 6CC  Final   Culture NO GROWTH < 12 HOURS  Final   Report Status PENDING  Incomplete    Coagulation Studies:  Recent Labs  11/07/15 0452  LABPROT 14.8  INR 1.14    Urinalysis:  Recent Labs Lab 11/06/15 1510  COLORURINE COLORLESS*  LABSPEC 1.002*  PHURINE 6.0  GLUCOSEU NEGATIVE  HGBUR NEGATIVE  BILIRUBINUR NEGATIVE  KETONESUR NEGATIVE  PROTEINUR NEGATIVE  NITRITE NEGATIVE  LEUKOCYTESUR NEGATIVE    Lipid Panel:  No results found for: CHOL, TRIG, HDL, CHOLHDL, VLDL, LDLCALC  HgbA1C: No results found for: HGBA1C  Urine Drug Screen:     Component Value Date/Time   LABOPIA NONE DETECTED 11/06/2015 1510   LABBENZ NONE DETECTED  11/06/2015 1510   AMPHETMU NONE DETECTED 11/06/2015 1510   THCU NONE DETECTED 11/06/2015 1510   LABBARB NONE DETECTED 11/06/2015 1510    Alcohol Level:  Recent Labs Lab 11/06/15 1510 11/07/15 0452  ETH 417* 146*    Other results: EKG: normal EKG, normal sinus rhythm, unchanged from previous tracings.  Imaging: Dg Chest 1 View  11/06/2015  CLINICAL DATA:  Patient found unresponsive. Assess endotracheal tube positioning. Initial encounter. EXAM: CHEST 1 VIEW COMPARISON:  Chest radiograph performed earlier today at 4:02 p.m. FINDINGS: The patient's endotracheal tube is seen ending 10 cm above the carina. This should be advanced 7 cm. The enteric tube is noted extending below the diaphragm. The lungs are well-aerated and clear. There is no evidence of focal opacification, pleural effusion or pneumothorax. The cardiomediastinal silhouette is within normal limits. No acute osseous abnormalities are seen. IMPRESSION: 1. Endotracheal tube seen ending 10 cm above the carina. This should be advanced 7 cm. 2. No acute cardiopulmonary process seen. Electronically Signed   By: Roanna Raider M.D.   On: 11/06/2015 19:51   Ct Head Wo Contrast  11/06/2015  CLINICAL DATA:  Dropped off at dorm this  morning after night of alcohol use and possible drugs. Given narcan. Pinpoint pupils as patient unresponsive and week gag reflex. EXAM: CT HEAD WITHOUT CONTRAST TECHNIQUE: Contiguous axial images were obtained from the base of the skull through the vertex without intravenous contrast. COMPARISON:  None. FINDINGS: Ventricles and cisterns are within normal. There is no mass, shift of midline structures or acute hemorrhage. No evidence of acute infarction. 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 Bony structures are within normal. IMPRESSION: No acute intracranial findings. Electronically Signed   By: Elberta Fortis M.D.   On: 11/06/2015 17:17   Portable Chest 1 View  11/07/2015  CLINICAL DATA:   Respiratory failure EXAM: PORTABLE CHEST 1 VIEW COMPARISON:  Portable chest x-ray of November 06, 2015 FINDINGS: The lungs are well-expanded. Patchy interstitial infiltrate persists in the right lower lobe. There is no pleural effusion or pneumothorax. The heart and pulmonary vascularity are normal. The endotracheal tube tip projects 3.6 cm above the carina. The esophagogastric tube tip projects below the inferior margin of the image. IMPRESSION: Left lower lobe subsegmental atelectasis and more conspicuous today. The examination is otherwise unremarkable. The endotracheal tube has been advanced since yesterday's study. Electronically Signed   By: David  [place] M.D.   On: 11/07/2015 07:47   Dg Chest Port 1 View  11/06/2015  CLINICAL DATA:  NG tube placement. Found unresponsive and warm room today. EXAM: PORTABLE CHEST 1 VIEW COMPARISON:  None. FINDINGS: Endotracheal tube with tip 7.7 cm above the carina. Enteric tube courses into the stomach with tip and side port within the stomach in the left upper quadrant. Lungs are adequately inflated without consolidation or effusion. Cardiomediastinal silhouette is within normal. Remaining bones and soft tissues are normal. IMPRESSION: No acute cardiopulmonary disease. Tubes and lines as described. Electronically Signed   By: Elberta Fortis M.D.   On: 11/06/2015 16:12     Assessment/Plan:  19 y.o. male has a past medical history significant for acne brought to ER after being found unresponsive with vomitus on his face. Pt was intoxicated.   He is s/p extubation and following commands.    - Mental status in setting of intoxication.  - No need for any further imaging at this point - d/c planning.  11/07/2015, 5:19 PM

## 2015-11-07 NOTE — Progress Notes (Signed)
   11/07/15 1300  Clinical Encounter Type  Visited With Patient and family together  Visit Type Initial  Consult/Referral To Chaplain  Spiritual Encounters  Spiritual Needs Emotional  Stress Factors  Patient Stress Factors None identified  Family Stress Factors None identified  Chaplain rounded in the unit and offered a compassionate presence and support to the family as applicable. Chaplain Alois Mincer A. Xzaria Teo Ext. [phone]

## 2015-11-07 NOTE — Care Management Note (Signed)
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 Remains febrile. Receiving IV antibiotics[place]Case management will follow for discharge planning.                 Action/Plan:   Expected Discharge Date:                  Expected Discharge Plan:     In-House Referral:     Discharge planning Services     Post Acute Care Choice:    Choice offered to:     DME Arranged:    DME Agency:     HH Arranged:    HH Agency:     Status of Service:     Medicare Important Message Given:    Date Medicare IM Given:    Medicare IM give by:    Date Additional Medicare IM Given:    Additional Medicare Important Message give by:     If discussed at Long Length of Stay Meetings, dates discussed:    Additional Comments:  Shreyas Piatkowski A, RN 11/07/2015, 3:56 PM

## 2015-11-07 NOTE — Progress Notes (Signed)
[facility]   PATIENT NAME: Richard Fleming    MR#:  [id number]  DATE OF BIRTH:  01-22-1996  SUBJECTIVE:  CHIEF COMPLAINT:   Chief Complaint  Patient presents with  . Ingestion    unresposive     Had alcohol intoxication- vomited, intubated, s/p extubation today- no complains. Had fever last night.  REVIEW OF SYSTEMS:  CONSTITUTIONAL: positive for fever,  No fatigue or weakness.  EYES: No blurred or double vision.  EARS, NOSE, AND THROAT: No tinnitus or ear pain.  RESPIRATORY: No cough, shortness of breath, wheezing or hemoptysis.  CARDIOVASCULAR: No chest pain, orthopnea, edema.  GASTROINTESTINAL: No nausea, vomiting, diarrhea or abdominal pain.  GENITOURINARY: No dysuria, hematuria.  ENDOCRINE: No polyuria, nocturia,  HEMATOLOGY: No anemia, easy bruising or bleeding SKIN: No rash or lesion. MUSCULOSKELETAL: No joint pain or arthritis.   NEUROLOGIC: No tingling, numbness, weakness.  PSYCHIATRY: No anxiety or depression.   ROS  DRUG ALLERGIES:  Not on File  VITALS:  Blood pressure 123/51, pulse 114, temperature 99.5 F (37.5 C), temperature source Oral, resp. rate 18, height 6' (1.829 m), weight 75.7 kg (166 lb 14.2 oz), SpO2 98 %.  PHYSICAL EXAMINATION:  GENERAL:  19 y.o.-year-old patient lying in the bed with no acute distress.  EYES: Pupils equal, round, reactive to light and accommodation. No scleral icterus. Extraocular muscles intact.  HEENT: Head atraumatic, normocephalic. Oropharynx and nasopharynx clear.  NECK:  Supple, no jugular venous distention. No thyroid enlargement, no tenderness.  LUNGS: Normal breath sounds bilaterally, no wheezing, rales,rhonchi or crepitation. No use of accessory muscles of respiration.  CARDIOVASCULAR: S1, S2 normal. No murmurs, rubs, or gallops.  ABDOMEN: Soft, nontender, nondistended. Bowel sounds present. No organomegaly or mass.  EXTREMITIES: No pedal edema, cyanosis, or clubbing.   NEUROLOGIC: Cranial nerves II through XII are intact. Muscle strength 5/5 in all extremities. Sensation intact. Gait not checked.  PSYCHIATRIC: The patient is alert and oriented x 3.  SKIN: No obvious rash, lesion, or ulcer.   Physical Exam LABORATORY PANEL:   CBC  Recent Labs Lab 11/07/15 0452  WBC 22.2*  HGB 16.5  HCT 48.5  PLT 265   ------------------------------------------------------------------------------------------------------------------  Chemistries   Recent Labs Lab 11/07/15 0452  NA 144  K 3.5  CL 112*  CO2 24  GLUCOSE 136*  BUN 12  CREATININE 0.75  CALCIUM 8.6*  AST 22  ALT 23  ALKPHOS 69  BILITOT 0.9   ------------------------------------------------------------------------------------------------------------------  Cardiac Enzymes No results for input(s): TROPONINI in the last 168 hours. ------------------------------------------------------------------------------------------------------------------  RADIOLOGY:  Dg Chest 1 View  11/06/2015  CLINICAL DATA:  Patient found unresponsive. Assess endotracheal tube positioning. Initial encounter. EXAM: CHEST 1 VIEW COMPARISON:  Chest radiograph performed earlier today at 4:02 p.m. FINDINGS: The patient's endotracheal tube is seen ending 10 cm above the carina. This should be advanced 7 cm. The enteric tube is noted extending below the diaphragm. The lungs are well-aerated and clear. There is no evidence of focal opacification, pleural effusion or pneumothorax. The cardiomediastinal silhouette is within normal limits. No acute osseous abnormalities are seen. IMPRESSION: 1. Endotracheal tube seen ending 10 cm above the carina. This should be advanced 7 cm. 2. No acute cardiopulmonary process seen. Electronically Signed   By: Roanna Raider M.D.   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 Given  narcan. Pinpoint pupils as patient unresponsive and week gag reflex. EXAM: CT HEAD WITHOUT CONTRAST TECHNIQUE: Contiguous axial images were obtained from the base of the skull through the vertex without intravenous contrast. COMPARISON:  None. FINDINGS: Ventricles and cisterns are within normal. There is no mass, shift of midline structures or acute hemorrhage. No evidence of acute infarction. 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 Bony structures are within normal. IMPRESSION: No acute intracranial findings. Electronically Signed   By: Elberta Fortisaniel  Boyle M.D.   On: 11/06/2015 17:17   Portable Chest 1 View  11/07/2015  CLINICAL DATA:  Respiratory failure EXAM: PORTABLE CHEST 1 VIEW COMPARISON:  Portable chest x-ray of November 06, 2015 FINDINGS: The lungs are well-expanded. Patchy interstitial infiltrate persists in the right lower lobe. There is no pleural effusion or pneumothorax. The heart and pulmonary vascularity are normal. The endotracheal tube tip projects 3.6 cm above the carina. The esophagogastric tube tip projects below the inferior margin of the image. IMPRESSION: Left lower lobe subsegmental atelectasis and more conspicuous today. The examination is otherwise unremarkable. The endotracheal tube has been advanced since yesterday's study. Electronically Signed   By: David  SwazilandJordan M.D.   On: 11/07/2015 07:47   Dg Chest Port 1 View  11/06/2015  CLINICAL DATA:  NG tube placement. Found unresponsive and warm room today. EXAM: PORTABLE CHEST 1 VIEW COMPARISON:  None. FINDINGS: Endotracheal tube with tip 7.7 cm above the carina. Enteric tube courses into the stomach with tip and side port within the stomach in the left upper quadrant. Lungs are adequately inflated without consolidation or effusion. Cardiomediastinal silhouette is within normal. Remaining bones and soft tissues are normal. IMPRESSION: No acute cardiopulmonary disease. Tubes and lines as described. Electronically Signed   By:  Elberta Fortisaniel  Boyle M.D.   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   Intuabated for airway protection.   S/p extubation.   Completely alert and oriented now.   Transfer to floor.  * Alcohol intoxication   Councelled against excessive use.    He is not a regualr drinker.   His mother was present in room during exam.  * Fever   Likely atelactasis.    Seen by pulm. Given one dose of vanc overnight.   He does not feel this is pneumonia   Will hold Abx today, and monitor tomorrow.   All the records are reviewed and case discussed with Care Management/Social Workerr. Management plans discussed with the patient, family and they are in agreement.  CODE STATUS: full.  TOTAL TIME TAKING CARE OF THIS PATIENT: 35 minutes.     POSSIBLE D/C IN 1-2 DAYS, DEPENDING ON CLINICAL CONDITION.   Altamese DillingVACHHANI, Boleslaw Borghi M.D on 11/07/2015   Between 7am to 6pm - Pager - [phone]  After 6pm go to [email or web address] - password [email or web address] [facility]  [facility]  Office  [phone]  CC: Primary care physician; No primary care provider on file.  Note: This dictation was prepared with Dragon dictation along with smaller phrase technology. Any transcriptional errors that result from this process are unintentional.

## 2015-11-07 NOTE — Progress Notes (Signed)
ANTIBIOTIC CONSULT NOTE - INITIAL  Pharmacy Consult for vancomycin Indication: pneumonia  Not on File  Patient Measurements: Height: 5\' 8"  (172.7 cm) Weight: 164 lb 10.9 oz (74.7 kg) IBW/kg (Calculated) : 68.4 Adjusted Body Weight: 70.9 kg  Vital Signs: Temp: 102.2 F (39 C) (11/14 0000) Temp Source: Core (Comment) (11/14 0000) BP: 106/61 mmHg (11/14 0000) Pulse Rate: 127 (11/14 0000) Intake/Output from previous day: 11/13 0701 - 11/14 0700 In: 650 [I.V.:600; IV Piggyback:50] Out: 1010 [Urine:1000; Emesis/NG output:10] Intake/Output from this shift: Total I/O In: 600 [I.V.:600] Out: 1000 [Urine:1000]  Labs:  Recent Labs  11/06/15 1510  WBC 9.3  HGB 17.9  PLT 273  CREATININE 0.95   Estimated Creatinine Clearance: 121 mL/min (by C-G formula based on Cr of 0.95). No results for input(s): VANCOTROUGH, VANCOPEAK, VANCORANDOM, GENTTROUGH, GENTPEAK, GENTRANDOM, TOBRATROUGH, TOBRAPEAK, TOBRARND, AMIKACINPEAK, AMIKACINTROU, AMIKACIN in the last 72 hours.   Microbiology: Recent Results (from the past 720 hour(s))  MRSA PCR Screening     Status: None   Collection Time: 11/06/15  2:54 PM  Result Value Ref Range Status   MRSA by PCR NEGATIVE NEGATIVE Final    Comment:        The GeneXpert MRSA Assay (FDA approved for NASAL specimens only), is one component of a comprehensive MRSA colonization surveillance program. It is not intended to diagnose MRSA infection nor to guide or monitor treatment for MRSA infections.     Medical History: History reviewed. No pertinent past medical history.  Medications:  Infusions:  . dextrose 5 % and 0.45 % NaCl with KCl 20 mEq/L 125 mL/hr at 11/06/15 1912   Assessment: 19 yom found unresponsive. Starting empiric abx for aspiration pneumonia. Ethyl alcohol level toxic 417 mg/dL, LA 2.3, HR > 161100, labile BP.   Vd 49.6 L, Ke 0.109 hr-1, T1/2 6.4 hr  Goal of Therapy:  Vancomycin trough level 15-20 mcg/ml  Plan:  Expected  duration 7 days with resolution of temperature and/or normalization of WBC. Vancomycin 1 gm IV Q8H with stacked dosing second dose 6 hours after first, predicted trough 15 mcg/mL. Will continue to follow and adjust as needed to maintain trough 15 to 20 mcg/mL.  Carola FrostNathan A Mcguire Gasparyan, Pharm.D.  Clinical Pharmacist 11/07/2015,12:16 AM

## 2015-11-07 NOTE — Progress Notes (Signed)
Patient extubated by RT and patient in 2 liter [place]. O2 sat is 99%. Foley catheter removed. Patient voided 150 ml urine.

## 2015-11-07 NOTE — Progress Notes (Signed)
ANTIBIOTIC CONSULT NOTE - INITIAL  Pharmacy Consult for vancomycin/Zosyn Indication: sepsis  Not on File  Patient Measurements: Height: 6' (182.9 cm) Weight: 166 lb 14.2 oz (75.7 kg) IBW/kg (Calculated) : 77.6 Adjusted Body Weight: 70.9 kg  Vital Signs: Temp: 102.8 F (39.3 C) (11/14 2056) Temp Source: Oral (11/14 2056) BP: 131/59 mmHg (11/14 2056) Pulse Rate: 117 (11/14 2056) Intake/Output from previous day: 11/13 0701 - 11/14 0700 In: 2055 [I.V.:1475; NG/GT:130; IV Piggyback:450] Out: 1260 [Urine:1250; Emesis/NG output:10] Intake/Output from this shift:    Labs:  Recent Labs  11/06/15 1510 11/07/15 0452  WBC 9.3 22.2*  HGB 17.9 16.5  PLT 273 265  CREATININE 0.95 0.75   Estimated Creatinine Clearance: 159 mL/min (by C-G formula based on Cr of 0.75). No results for input(s): VANCOTROUGH, VANCOPEAK, VANCORANDOM, GENTTROUGH, GENTPEAK, GENTRANDOM, TOBRATROUGH, TOBRAPEAK, TOBRARND, AMIKACINPEAK, AMIKACINTROU, AMIKACIN in the last 72 hours.   Microbiology: Recent Results (from the past 720 hour(s))  MRSA PCR Screening     Status: None   Collection Time: 11/06/15  2:54 PM  Result Value Ref Range Status   MRSA by PCR NEGATIVE NEGATIVE Final    Comment:        The GeneXpert MRSA Assay (FDA approved for NASAL specimens only), is one component of a comprehensive MRSA colonization surveillance program. It is not intended to diagnose MRSA infection nor to guide or monitor treatment for MRSA infections.   Culture, blood (routine x 2)     Status: None (Preliminary result)   Collection Time: 11/07/15 12:19 AM  Result Value Ref Range Status   Specimen Description BLOOD LEFT HAND  Final   Special Requests BOTTLES DRAWN AEROBIC AND ANAEROBIC 6CC  Final   Culture NO GROWTH < 12 HOURS  Final   Report Status PENDING  Incomplete  Culture, blood (routine x 2)     Status: None (Preliminary result)   Collection Time: 11/07/15 12:39 AM  Result Value Ref Range Status   Specimen Description BLOOD LEFT HAND  Final   Special Requests BOTTLES DRAWN AEROBIC AND ANAEROBIC 6CC  Final   Culture NO GROWTH < 12 HOURS  Final   Report Status PENDING  Incomplete    Medical History: History reviewed. No pertinent past medical history.  Medications:  Infusions:    Assessment: 19 yom found unresponsive. Starting empiric abx for aspiration pneumonia. Abx d/c'd now restarting for sepsis. Temp 102.8 F  Vd 49.6 L, Ke 0.109 hr-1, T1/2 6.4 hr  Goal of Therapy:  Vancomycin trough level 15-20 mcg/ml  Plan:  Will reorder vancomycin 1000 mg IV q8h.   Will continue to follow and adjust as needed to maintain trough 15 to 20 mcg/mL. Will order trough before 5th dose - 11/16 at 0500  Will order Zosyn 3.375 g IV q8h EI.   Pharmacy will continue to follow  Marty HeckWang, Freddy Kinne L, Pharm.D.  Clinical Pharmacist 11/07/2015,9:24 PM

## 2015-11-08 ENCOUNTER — Inpatient Hospital Stay: Payer: BLUE CROSS/BLUE SHIELD

## 2015-11-08 LAB — BASIC METABOLIC PANEL
ANION GAP: 6 (ref 5–15)
BUN: 13 mg/dL (ref 6–20)
CALCIUM: 9.1 mg/dL (ref 8.9–10.3)
CO2: 24 mmol/L (ref 22–32)
CREATININE: 0.83 mg/dL (ref 0.61–1.24)
Chloride: 108 mmol/L (ref 101–111)
Glucose, Bld: 142 mg/dL — ABNORMAL HIGH (ref 65–99)
Potassium: 4 mmol/L (ref 3.5–5.1)
SODIUM: 138 mmol/L (ref 135–145)

## 2015-11-08 LAB — CBC
HEMATOCRIT: 42.7 % (ref 40.0–52.0)
Hemoglobin: 14.2 g/dL (ref 13.0–18.0)
MCH: 30.4 pg (ref 26.0–34.0)
MCHC: 33.2 g/dL (ref 32.0–36.0)
MCV: 91.6 fL (ref 80.0–100.0)
PLATELETS: 236 10*3/uL (ref 150–440)
RBC: 4.66 MIL/uL (ref 4.40–5.90)
RDW: 12 % (ref 11.5–14.5)
WBC: 21 10*3/uL — AB (ref 3.8–10.6)

## 2015-11-08 MED ORDER — PANTOPRAZOLE SODIUM 40 MG PO TBEC
40.0000 mg | DELAYED_RELEASE_TABLET | Freq: Every day | ORAL | Status: DC
  Administered 2015-11-08 – 2015-11-09 (×2): 40 mg via ORAL
  Filled 2015-11-08 (×2): qty 1

## 2015-11-08 NOTE — Progress Notes (Signed)
Notified Dr Elisabeth PigeonVachhani of events of last night and requested to change protonix to po , orders received

## 2015-11-08 NOTE — Consult Note (Signed)
[facility] Infectious Disease     Reason for Consult: Fever, leukocytosis    Referring Physician: Boyce Medici Date of Admission:  11/06/2015   Principal Problem:   Unresponsive state Active Problems:   Alcohol causing toxic effect   HPI: Richard Fleming is a 19 y.o. male with no medical issues admitted after binge drinking and found unresponsive with vomit. He was initially intubated and in ICU. Initially afebrile and wbc 9 but temp spiked to 102 and wbc up to 22.  Has been treated with zosyn and vanco since 11/14 Successfully extubated and on floor currently. He is alert and oriented. Denies any HA, neck stiffness, n.v. abd pain dysuria. Has a mild cough. No skin lesions.   History reviewed. No pertinent past medical history. History reviewed. No pertinent past surgical history. Social History  Substance Use Topics  . Smoking status: Never Smoker   . Smokeless tobacco: Never Used  . Alcohol Use: None   History reviewed. No pertinent family history.  Allergies: Not on File  Current antibiotics: Antibiotics Given (last 72 hours)    Date/Time Action Medication Dose Rate   11/07/15 0110 Given   vancomycin (VANCOCIN) IVPB 1000 mg/200 mL premix 1,000 mg 200 mL/hr   11/07/15 5830 Given   vancomycin (VANCOCIN) IVPB 1000 mg/200 mL premix 1,000 mg 200 mL/hr   11/07/15 2149 Given   vancomycin (VANCOCIN) IVPB 1000 mg/200 mL premix 1,000 mg 200 mL/hr   11/07/15 2302 Given   piperacillin-tazobactam (ZOSYN) IVPB 3.375 g 3.375 g 12.5 mL/hr   11/08/15 0449 Given   vancomycin (VANCOCIN) IVPB 1000 mg/200 mL premix 1,000 mg 200 mL/hr   11/08/15 0612 Given   piperacillin-tazobactam (ZOSYN) IVPB 3.375 g 3.375 g 12.5 mL/hr   11/08/15 1302 Given   piperacillin-tazobactam (ZOSYN) IVPB 3.375 g 3.375 g 12.5 mL/hr   11/08/15 1302 Given   vancomycin (VANCOCIN) IVPB 1000 mg/200 mL premix 1,000 mg 200 mL/hr      MEDICATIONS: . pantoprazole  40 mg Oral Daily  . piperacillin-tazobactam  (ZOSYN)  IV  3.375 g Intravenous 3 times per day  . sodium chloride  3 mL Intravenous Q12H  . vancomycin  1,000 mg Intravenous Q8H    Review of Systems - 11 systems reviewed and negative per HPI   OBJECTIVE: Temp:  [99.1 F (37.3 C)-102.8 F (39.3 C)] 99.1 F (37.3 C) (11/15 1628) Pulse Rate:  [86-117] 87 (11/15 1628) Resp:  [18-19] 18 (11/15 1628) BP: (124-136)/(54-68) 136/68 mmHg (11/15 1628) SpO2:  [96 %-98 %] 98 % (11/15 1628) Physical Exam  Constitutional: He is oriented to person, place, and time. He appears well-developed and well-nourished. No distress.  HENT:  Mouth/Throat: Oropharynx is clear and moist. No oropharyngeal exudate.  Cardiovascular: Normal rate, regular rhythm and normal heart sounds. Exam reveals no gallop and no friction rub.  No murmur heard.  Pulmonary/Chest: mild rhonchi L base  Abdominal: Soft. Bowel sounds are normal. He exhibits no distension. There is no tenderness.  Lymphadenopathy:  He has no cervical adenopathy.  Neurological: He is alert and oriented to person, place, and time.  Skin: Skin is warm and dry. No rash noted. No erythema.  Psychiatric: He has a normal mood and affect. His behavior is normal.     LABS: Results for orders placed or performed during the hospital encounter of 11/06/15 (from the past 48 hour(s))  Blood gas, arterial     Status: Abnormal   Collection Time: 11/06/15  5:20 PM  Result Value Ref Range  FIO2 0.50    Delivery systems VENTILATOR    Mode ASSIST CONTROL    VT 500 mL   LHR 20 resp/min   Peep/cpap 5.0 cm H20   pH, Arterial 7.38 7.350 - 7.450   pCO2 arterial 40 32.0 - 48.0 mmHg   pO2, Arterial 162 (H) 83.0 - 108.0 mmHg   Bicarbonate 23.7 21.0 - 28.0 mEq/L   Acid-base deficit 1.3 0.0 - 2.0 mmol/L   O2 Saturation 99.4 %   Patient temperature 37.0    Collection site RIGHT RADIAL    Sample type ARTERIAL DRAW    Allens test (pass/fail) POSITIVE (A) PASS  Glucose, capillary     Status: Abnormal    Collection Time: 11/06/15  7:02 PM  Result Value Ref Range   Glucose-Capillary 119 (H) 65 - 99 mg/dL  Lactic acid, plasma     Status: Abnormal   Collection Time: 11/06/15  7:16 PM  Result Value Ref Range   Lactic Acid, Venous 2.3 (HH) 0.5 - 2.0 mmol/L    Comment: CRITICAL RESULT CALLED TO, READ BACK BY AND VERIFIED WITH HIRAL PATEL AT 2008 11/06/15.PMH  Culture, blood (routine x 2)     Status: None (Preliminary result)   Collection Time: 11/07/15 12:19 AM  Result Value Ref Range   Specimen Description BLOOD LEFT HAND    Special Requests BOTTLES DRAWN AEROBIC AND ANAEROBIC 6CC    Culture NO GROWTH 1 DAY    Report Status PENDING   Culture, blood (routine x 2)     Status: None (Preliminary result)   Collection Time: 11/07/15 12:39 AM  Result Value Ref Range   Specimen Description BLOOD LEFT HAND    Special Requests BOTTLES DRAWN AEROBIC AND ANAEROBIC 6CC    Culture NO GROWTH 1 DAY    Report Status PENDING   Ethanol     Status: Abnormal   Collection Time: 11/07/15  4:52 AM  Result Value Ref Range   Alcohol, Ethyl (B) 146 (H) <5 mg/dL    Comment:        LOWEST DETECTABLE LIMIT FOR SERUM ALCOHOL IS 5 mg/dL FOR MEDICAL PURPOSES ONLY   Protime-INR     Status: None   Collection Time: 11/07/15  4:52 AM  Result Value Ref Range   Prothrombin Time 14.8 11.4 - 15.0 seconds   INR 1.14   CBC     Status: Abnormal   Collection Time: 11/07/15  4:52 AM  Result Value Ref Range   WBC 22.2 (H) 3.8 - 10.6 K/uL   RBC 5.22 4.40 - 5.90 MIL/uL   Hemoglobin 16.5 13.0 - 18.0 g/dL   HCT 48.5 40.0 - 52.0 %   MCV 92.9 80.0 - 100.0 fL   MCH 31.5 26.0 - 34.0 pg   MCHC 33.9 32.0 - 36.0 g/dL   RDW 12.4 11.5 - 14.5 %   Platelets 265 150 - 440 K/uL  Comprehensive metabolic panel     Status: Abnormal   Collection Time: 11/07/15  4:52 AM  Result Value Ref Range   Sodium 144 135 - 145 mmol/L   Potassium 3.5 3.5 - 5.1 mmol/L   Chloride 112 (H) 101 - 111 mmol/L   CO2 24 22 - 32 mmol/L   Glucose, Bld 136  (H) 65 - 99 mg/dL   BUN 12 6 - 20 mg/dL   Creatinine, Ser 0.75 0.61 - 1.24 mg/dL   Calcium 8.6 (L) 8.9 - 10.3 mg/dL   Total Protein 7.5 6.5 - 8.1 g/dL  Albumin 4.4 3.5 - 5.0 g/dL   AST 22 15 - 41 U/L   ALT 23 17 - 63 U/L   Alkaline Phosphatase 69 38 - 126 U/L   Total Bilirubin 0.9 0.3 - 1.2 mg/dL   GFR calc non Af Amer >60 >60 mL/min   GFR calc Af Amer >60 >60 mL/min    Comment: (NOTE) The eGFR has been calculated using the CKD EPI equation. This calculation has not been validated in all clinical situations. eGFR's persistently <60 mL/min signify possible Chronic Kidney Disease.    Anion gap 8 5 - 15  Rapid HIV screen (HIV 1/2 Ab+Ag)     Status: None   Collection Time: 11/07/15  4:52 AM  Result Value Ref Range   HIV-1 P24 Antigen - HIV24 NON REACTIVE NON REACTIVE   HIV 1/2 Antibodies NON REACTIVE NON REACTIVE   Interpretation (HIV Ag Ab)      A non reactive test result means that HIV 1 or HIV 2 antibodies and HIV 1 p24 antigen were not detected in the specimen.  Culture, blood (routine x 2)     Status: None (Preliminary result)   Collection Time: 11/07/15  9:18 PM  Result Value Ref Range   Specimen Description BLOOD LEFT ASSIST CONTROL    Special Requests BOTTLES DRAWN AEROBIC AND ANAEROBIC 8ML    Culture NO GROWTH < 12 HOURS    Report Status PENDING   Culture, blood (routine x 2)     Status: None (Preliminary result)   Collection Time: 11/07/15  9:27 PM  Result Value Ref Range   Specimen Description BLOOD    Special Requests NONE    Culture NO GROWTH < 12 HOURS    Report Status PENDING   Urinalysis complete, with microscopic ([facility] only)     Status: Abnormal   Collection Time: 11/07/15  9:40 PM  Result Value Ref Range   Color, Urine YELLOW (A) YELLOW   APPearance CLEAR (A) CLEAR   Glucose, UA NEGATIVE NEGATIVE mg/dL   Bilirubin Urine NEGATIVE NEGATIVE   Ketones, ur NEGATIVE NEGATIVE mg/dL   Specific Gravity, Urine 1.020 1.005 - 1.030   Hgb urine dipstick NEGATIVE  NEGATIVE   pH 7.0 5.0 - 8.0   Protein, ur NEGATIVE NEGATIVE mg/dL   Nitrite NEGATIVE NEGATIVE   Leukocytes, UA NEGATIVE NEGATIVE   RBC / HPF NONE SEEN 0 - 5 RBC/hpf   WBC, UA 0-5 0 - 5 WBC/hpf   Bacteria, UA NONE SEEN NONE SEEN   Squamous Epithelial / LPF NONE SEEN NONE SEEN   Mucous PRESENT   Urine culture     Status: None (Preliminary result)   Collection Time: 11/07/15  9:40 PM  Result Value Ref Range   Specimen Description URINE, CLEAN CATCH    Special Requests NONE    Culture NO GROWTH < 12 HOURS    Report Status PENDING   CBC     Status: Abnormal   Collection Time: 11/08/15  8:49 AM  Result Value Ref Range   WBC 21.0 (H) 3.8 - 10.6 K/uL   RBC 4.66 4.40 - 5.90 MIL/uL   Hemoglobin 14.2 13.0 - 18.0 g/dL   HCT 42.7 40.0 - 52.0 %   MCV 91.6 80.0 - 100.0 fL   MCH 30.4 26.0 - 34.0 pg   MCHC 33.2 32.0 - 36.0 g/dL   RDW 12.0 11.5 - 14.5 %   Platelets 236 150 - 440 K/uL  Basic metabolic panel     Status: Abnormal  Collection Time: 11/08/15  8:49 AM  Result Value Ref Range   Sodium 138 135 - 145 mmol/L   Potassium 4.0 3.5 - 5.1 mmol/L   Chloride 108 101 - 111 mmol/L   CO2 24 22 - 32 mmol/L   Glucose, Bld 142 (H) 65 - 99 mg/dL   BUN 13 6 - 20 mg/dL   Creatinine, Ser 0.83 0.61 - 1.24 mg/dL   Calcium 9.1 8.9 - 10.3 mg/dL   GFR calc non Af Amer >60 >60 mL/min   GFR calc Af Amer >60 >60 mL/min    Comment: (NOTE) The eGFR has been calculated using the CKD EPI equation. This calculation has not been validated in all clinical situations. eGFR's persistently <60 mL/min signify possible Chronic Kidney Disease.    Anion gap 6 5 - 15   No components found for: ESR, C REACTIVE PROTEIN MICRO: Recent Results (from the past 720 hour(s))  MRSA PCR Screening     Status: None   Collection Time: 11/06/15  2:54 PM  Result Value Ref Range Status   MRSA by PCR NEGATIVE NEGATIVE Final    Comment:        The GeneXpert MRSA Assay (FDA approved for NASAL specimens only), is one component  of a comprehensive MRSA colonization surveillance program. It is not intended to diagnose MRSA infection nor to guide or monitor treatment for MRSA infections.   Culture, blood (routine x 2)     Status: None (Preliminary result)   Collection Time: 11/07/15 12:19 AM  Result Value Ref Range Status   Specimen Description BLOOD LEFT HAND  Final   Special Requests BOTTLES DRAWN AEROBIC AND ANAEROBIC 6CC  Final   Culture NO GROWTH 1 DAY  Final   Report Status PENDING  Incomplete  Culture, blood (routine x 2)     Status: None (Preliminary result)   Collection Time: 11/07/15 12:39 AM  Result Value Ref Range Status   Specimen Description BLOOD LEFT HAND  Final   Special Requests BOTTLES DRAWN AEROBIC AND ANAEROBIC 6CC  Final   Culture NO GROWTH 1 DAY  Final   Report Status PENDING  Incomplete  Culture, blood (routine x 2)     Status: None (Preliminary result)   Collection Time: 11/07/15  9:18 PM  Result Value Ref Range Status   Specimen Description BLOOD LEFT ASSIST CONTROL  Final   Special Requests BOTTLES DRAWN AEROBIC AND ANAEROBIC 8ML  Final   Culture NO GROWTH < 12 HOURS  Final   Report Status PENDING  Incomplete  Culture, blood (routine x 2)     Status: None (Preliminary result)   Collection Time: 11/07/15  9:27 PM  Result Value Ref Range Status   Specimen Description BLOOD  Final   Special Requests NONE  Final   Culture NO GROWTH < 12 HOURS  Final   Report Status PENDING  Incomplete  Urine culture     Status: None (Preliminary result)   Collection Time: 11/07/15  9:40 PM  Result Value Ref Range Status   Specimen Description URINE, CLEAN CATCH  Final   Special Requests NONE  Final   Culture NO GROWTH < 12 HOURS  Final   Report Status PENDING  Incomplete    IMAGING: Dg Chest 1 View  11/06/2015  CLINICAL DATA:  Patient found unresponsive. Assess endotracheal tube positioning. Initial encounter. EXAM: CHEST 1 VIEW COMPARISON:  Chest radiograph performed earlier today at 4:02  p.m. FINDINGS: The patient's endotracheal tube is seen ending 10 cm  above the carina. This should be advanced 7 cm. The enteric tube is noted extending below the diaphragm. The lungs are well-aerated and clear. There is no evidence of focal opacification, pleural effusion or pneumothorax. The cardiomediastinal silhouette is within normal limits. No acute osseous abnormalities are seen. IMPRESSION: 1. Endotracheal tube seen ending 10 cm above the carina. This should be advanced 7 cm. 2. No acute cardiopulmonary process seen. Electronically Signed   By: Garald Balding M.D.   On: 11/06/2015 19:51   Dg Chest 2 View  11/08/2015  CLINICAL DATA:  Fevers EXAM: CHEST - 2 VIEW COMPARISON:  11/07/2015 FINDINGS: The heart size and mediastinal contours are within normal limits. Both lungs are clear. The visualized skeletal structures are unremarkable. IMPRESSION: No active disease. Electronically Signed   By: Inez Catalina M.D.   On: 11/08/2015 09:15   Ct Head Wo Contrast  11/06/2015  CLINICAL DATA:  Dropped off at dorm this morning after night of alcohol use and possible drugs. Given narcan. Pinpoint pupils as patient unresponsive and week gag reflex. EXAM: CT HEAD WITHOUT CONTRAST TECHNIQUE: Contiguous axial images were obtained from the base of the skull through the vertex without intravenous contrast. COMPARISON:  None. FINDINGS: Ventricles and cisterns are within normal. There is no mass, shift of midline structures or acute hemorrhage. No evidence of acute infarction. 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 Bony structures are within normal. IMPRESSION: No acute intracranial findings. Electronically Signed   By: Marin Olp M.D.   On: 11/06/2015 17:17   Portable Chest 1 View  11/07/2015  CLINICAL DATA:  Respiratory failure EXAM: PORTABLE CHEST 1 VIEW COMPARISON:  Portable chest x-ray of November 06, 2015 FINDINGS: The lungs are well-expanded. Patchy interstitial infiltrate persists in the right  lower lobe. There is no pleural effusion or pneumothorax. The heart and pulmonary vascularity are normal. The endotracheal tube tip projects 3.6 cm above the carina. The esophagogastric tube tip projects below the inferior margin of the image. IMPRESSION: Left lower lobe subsegmental atelectasis and more conspicuous today. The examination is otherwise unremarkable. The endotracheal tube has been advanced since yesterday's study. Electronically Signed   By: Keyari Kleeman  [place] M.D.   On: 11/07/2015 07:47   Dg Chest Port 1 View  11/06/2015  CLINICAL DATA:  NG tube placement. Found unresponsive and warm room today. EXAM: PORTABLE CHEST 1 VIEW COMPARISON:  None. FINDINGS: Endotracheal tube with tip 7.7 cm above the carina. Enteric tube courses into the stomach with tip and side port within the stomach in the left upper quadrant. Lungs are adequately inflated without consolidation or effusion. Cardiomediastinal silhouette is within normal. Remaining bones and soft tissues are normal. IMPRESSION: No acute cardiopulmonary disease. Tubes and lines as described. Electronically Signed   By: Marin Olp M.D.   On: 11/06/2015 16:12    Assessment:   Bowen Kia is a 19 y.o. male admitted with ETOH intoxication, unconscious and with vomitus. He spiked fever and had elevated wbc. He likely has aspiration PNA as he had vomiting and was intubated. Clinically quite well . Recommendations In AM if remains afebrile can transition to oral augmentin for 10 days total therapy Thank you very much for allowing me to participate in the care of this patient. Please call with questions.   Cheral Marker. Ola Spurr, MD

## 2015-11-08 NOTE — Progress Notes (Signed)
[facility] - [place] at [facility]   PATIENT NAME: Richard Fleming    MR#:  [id number]  DATE OF BIRTH:  1996-01-25  SUBJECTIVE:  CHIEF COMPLAINT:   Chief Complaint  Patient presents with  . Ingestion    unresposive     Had alcohol intoxication- vomited, intubated, s/p extubation today- no complains. Had fever last night again.  REVIEW OF SYSTEMS:  CONSTITUTIONAL: positive for fever,  No fatigue or weakness.  EYES: No blurred or double vision.  EARS, NOSE, AND THROAT: No tinnitus or ear pain.  RESPIRATORY: No cough, shortness of breath, wheezing or hemoptysis.  CARDIOVASCULAR: No chest pain, orthopnea, edema.  GASTROINTESTINAL: No nausea, vomiting, diarrhea or abdominal pain.  GENITOURINARY: No dysuria, hematuria.  ENDOCRINE: No polyuria, nocturia,  HEMATOLOGY: No anemia, easy bruising or bleeding SKIN: No rash or lesion. MUSCULOSKELETAL: No joint pain or arthritis.   NEUROLOGIC: No tingling, numbness, weakness.  PSYCHIATRY: No anxiety or depression.   ROS  DRUG ALLERGIES:  Not on File  VITALS:  Blood pressure 136/68, pulse 87, temperature 99.1 F (37.3 C), temperature source Oral, resp. rate 18, height 6' (1.829 m), weight 75.7 kg (166 lb 14.2 oz), SpO2 98 %.  PHYSICAL EXAMINATION:  GENERAL:  19 y.o.-year-old patient lying in the bed with no acute distress.  EYES: Pupils equal, round, reactive to light and accommodation. No scleral icterus. Extraocular muscles intact.  HEENT: Head atraumatic, normocephalic. Oropharynx and nasopharynx clear.  NECK:  Supple, no jugular venous distention. No thyroid enlargement, no tenderness.  LUNGS: Normal breath sounds bilaterally, no wheezing, rales,rhonchi or crepitation. No use of accessory muscles of respiration.  CARDIOVASCULAR: S1, S2 normal. No murmurs, rubs, or gallops.  ABDOMEN: Soft, nontender, nondistended. Bowel sounds present. No organomegaly or mass.  EXTREMITIES: No pedal edema, cyanosis, or  clubbing.  NEUROLOGIC: Cranial nerves II through XII are intact. Muscle strength 5/5 in all extremities. Sensation intact. Gait not checked.  PSYCHIATRIC: The patient is alert and oriented x 3.  SKIN: No obvious rash, lesion, or ulcer.   Physical Exam LABORATORY PANEL:   CBC  Recent Labs Lab 11/08/15 0849  WBC 21.0*  HGB 14.2  HCT 42.7  PLT 236   ------------------------------------------------------------------------------------------------------------------  Chemistries   Recent Labs Lab 11/07/15 0452 11/08/15 0849  NA 144 138  K 3.5 4.0  CL 112* 108  CO2 24 24  GLUCOSE 136* 142*  BUN 12 13  CREATININE 0.75 0.83  CALCIUM 8.6* 9.1  AST 22  --   ALT 23  --   ALKPHOS 69  --   BILITOT 0.9  --    ------------------------------------------------------------------------------------------------------------------  Cardiac Enzymes No results for input(s): TROPONINI in the last 168 hours. ------------------------------------------------------------------------------------------------------------------  RADIOLOGY:  Dg Chest 1 View  11/06/2015  CLINICAL DATA:  Patient found unresponsive. Assess endotracheal tube positioning. Initial encounter. EXAM: CHEST 1 VIEW COMPARISON:  Chest radiograph performed earlier today at 4:02 p.m. FINDINGS: The patient's endotracheal tube is seen ending 10 cm above the carina. This should be advanced 7 cm. The enteric tube is noted extending below the diaphragm. The lungs are well-aerated and clear. There is no evidence of focal opacification, pleural effusion or pneumothorax. The cardiomediastinal silhouette is within normal limits. No acute osseous abnormalities are seen. IMPRESSION: 1. Endotracheal tube seen ending 10 cm above the carina. This should be advanced 7 cm. 2. No acute cardiopulmonary process seen. Electronically Signed   By: Roanna Raider M.D.   On: 11/06/2015 19:51   Dg Chest 2  View  11/08/2015  CLINICAL DATA:  Fevers EXAM:  CHEST - 2 VIEW COMPARISON:  11/07/2015 FINDINGS: The heart size and mediastinal contours are within normal limits. Both lungs are clear. The visualized skeletal structures are unremarkable. IMPRESSION: No active disease. Electronically Signed   By: Alcide CleverMark  Lukens M.D.   On: 11/08/2015 09:15   Portable Chest 1 View  11/07/2015  CLINICAL DATA:  Respiratory failure EXAM: PORTABLE CHEST 1 VIEW COMPARISON:  Portable chest x-ray of November 06, 2015 FINDINGS: The lungs are well-expanded. Patchy interstitial infiltrate persists in the right lower lobe. There is no pleural effusion or pneumothorax. The heart and pulmonary vascularity are normal. The endotracheal tube tip projects 3.6 cm above the carina. The esophagogastric tube tip projects below the inferior margin of the image. IMPRESSION: Left lower lobe subsegmental atelectasis and more conspicuous today. The examination is otherwise unremarkable. The endotracheal tube has been advanced since yesterday's study. Electronically Signed   By: David  [place]azil[place] M.D.   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   Intuabated for airway protection.   S/p extubation.   Completely alert and oriented now.   Transferred to floor.  * Alcohol intoxication   Councelled against excessive use.    He is not a regualr drinker.   His mother and father were present in room during exam.  * Fever   Initially suspected atelactasis.   But had fever again second day, all blood cultures are negatvie so far.   Urine is negative for infection, Xray chest clear.   No confusion, nausea, vomiting, diarrhea.   Started on vanc+ zosyn.   Called ID consult to help.  All the records are reviewed and case discussed with Care Management/Social Workerr. Management plans discussed with the patient, family and they are in agreement.  CODE  STATUS: full.  TOTAL TIME TAKING CARE OF THIS PATIENT: 35 minutes.   POSSIBLE D/C IN 1-2 DAYS, DEPENDING ON CLINICAL CONDITION.   Altamese DillingVACHHANI, Eleftherios Dudenhoeffer M.D on 11/08/2015   Between 7am to 6pm - Pager - [phone]  After 6pm go to [email or web address] - password [email or web address] [facility]  [facility]  Office  [phone]  CC: Primary care physician; No primary care provider on file.  Note: This dictation was prepared with Dragon dictation along with smaller phrase technology. Any transcriptional errors that result from this process are unintentional.

## 2015-11-09 LAB — CBC
HCT: 42.8 % (ref 40.0–52.0)
Hemoglobin: 14.8 g/dL (ref 13.0–18.0)
MCH: 31.8 pg (ref 26.0–34.0)
MCHC: 34.6 g/dL (ref 32.0–36.0)
MCV: 91.8 fL (ref 80.0–100.0)
PLATELETS: 235 10*3/uL (ref 150–440)
RBC: 4.67 MIL/uL (ref 4.40–5.90)
RDW: 12 % (ref 11.5–14.5)
WBC: 11 10*3/uL — ABNORMAL HIGH (ref 3.8–10.6)

## 2015-11-09 LAB — URINE CULTURE: Culture: NO GROWTH

## 2015-11-09 MED ORDER — AMOXICILLIN-POT CLAVULANATE 875-125 MG PO TABS
1.0000 | ORAL_TABLET | Freq: Two times a day (BID) | ORAL | Status: AC
Start: 2015-11-09 — End: ?

## 2015-11-09 NOTE — Discharge Instructions (Signed)
Regular diet. Alcohol cessation.

## 2015-11-09 NOTE — Discharge Summary (Signed)
[facility] [place] at [facility]   PATIENT NAME: Richard Fleming    MR#:  [id number]  DATE OF BIRTH:  [birth year]  DATE OF ADMISSION:  11/06/2015 ADMITTING PHYSICIAN: Marguarite Arbour, MD  DATE OF DISCHARGE: 11/09/2015 11:49 AM  PRIMARY CARE PHYSICIAN: No primary care provider on file.    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 No pertinent past medical history.  HOSPITAL COURSE:   * Toxic encephalopathy due to alcohol overuse.  He was intubated for airway protection.he is status post extubation.  Completely alert and oriented now. no complaint.  Alcohol intoxication  Councelled against excessive use.  He is not a regualr drinker.  * Fever  Initially suspected atelactasis.  But had fever again second day, all blood cultures are negatvie so far.  Urine is negative for infection, Xray chest clear.  No confusion, nausea, vomiting, diarhe was on vancomycin+ zosyn. Dr. Sampson Goon suggestedAugmentin by mouth for 10 days for possible aspiration.    stable, discharged to home today.   CONSULTS OBTAINED:  Treatment Team:  Clydie Braun, MD  DRUG ALLERGIES:  Not on File  DISCHARGE MEDICATIONS:   Discharge Medication List as of 11/09/2015 11:09 AM    START taking these medications   Details  amoxicillin-clavulanate (AUGMENTIN) 875-125 MG tablet Take 1 tablet by mouth 2 (two) times daily., Starting 11/09/2015, Until Discontinued, Print      CONTINUE these medications which have NOT CHANGED   Details  ACANYA gel Apply 1 application topically every morning. Apply sparingly to face., Starting 09/22/2015, Until Discontinued, Historical Med    Sulfacetamide Sodium, Acne, 10 % LOTN Apply 1 application topically at bedtime. Apply to affected  area on top of tazorac., Starting 10/10/2015, Until Discontinued, Historical Med    TAZORAC 0.05 % cream Apply 1 application topically every evening. Apply to affected area, Starting 10/11/2015, Until Discontinued, Historical Med         DISCHARGE INSTRUCTIONS:    If you experience worsening of your admission symptoms, develop shortness of breath, life threatening emergency, suicidal or homicidal thoughts you must seek medical attention immediately by calling 911 or calling your MD immediately  if symptoms less severe.  You Must read complete instructions/literature along with all the possible adverse reactions/side effects for all the Medicines you take and that have been prescribed to you. Take any new Medicines after you have completely understood and accept all the possible adverse reactions/side effects.   Please note  You were cared for by a hospitalist during your hospital stay. If you have any questions about your discharge medications or the care you received while you were in the hospital after you are discharged, you can call the unit and asked to speak with the hospitalist on call if the hospitalist that took care of you is not available. Once you are discharged, your primary care physician will handle any further medical issues. Please note that NO REFILLS for any discharge medications will be authorized once you are discharged, as it is imperative that you return to your primary care physician (or establish a relationship with a primary care physician if you do not have one) for your aftercare needs so that they can reassess your need for medications and monitor your lab values.    Today   SUBJECTIVE    no complaint.  VITAL SIGNS:  Blood pressure 130/59, pulse 89, temperature 98.9 F (37.2 C), temperature source Oral, resp. rate 18, height 6' (1.829 m), weight 77.338 kg (170 lb 8 oz), SpO2 98 %.  I/O:   Intake/Output Summary (Last 24 hours) at 11/09/15 1801 Last  data filed at 11/09/15 0900  Gross per 24 hour  Intake   1720 ml  Output      0 ml  Net   1720 ml    PHYSICAL EXAMINATION:  GENERAL:  19 y.o.-year-old patient lying in the bed with no acute distress.  EYES: Pupils equal, round, reactive to light and accommodation. No scleral icterus. Extraocular muscles intact.  HEENT: Head atraumatic, normocephalic. Oropharynx and nasopharynx clear.  NECK:  Supple, no jugular venous distention. No thyroid enlargement, no tenderness.  LUNGS: Normal breath sounds bilaterally, no wheezing, rales,rhonchi or crepitation. No use of accessory muscles of respiration.  CARDIOVASCULAR: S1, S2 normal. No murmurs, rubs, or gallops.  ABDOMEN: Soft, non-tender, non-distended. Bowel sounds present. No organomegaly or mass.  EXTREMITIES: No pedal edema, cyanosis, or clubbing.  NEUROLOGIC: Cranial nerves II through XII are intact. Muscle strength 5/5 in all extremities. Sensation intact. Gait not checked.  PSYCHIATRIC: The patient is alert and oriented x 3.  SKIN: No obvious rash, lesion, or ulcer.   DATA REVIEW:   CBC  Recent Labs Lab 11/09/15 0723  WBC 11.0*  HGB 14.8  HCT 42.8  PLT 235    Chemistries   Recent Labs Lab 11/07/15 0452 11/08/15 0849  NA 144 138  K 3.5 4.0  CL 112* 108  CO2 24 24  GLUCOSE 136* 142*  BUN 12 13  CREATININE 0.75 0.83  CALCIUM 8.6* 9.1  AST 22  --   ALT 23  --   ALKPHOS 69  --   BILITOT 0.9  --     Cardiac Enzymes No results for input(s): TROPONINI in the last 168 hours.  Microbiology Results  Results for orders placed or performed during the hospital encounter of 11/06/15  MRSA PCR Screening     Status: None   Collection Time: 11/06/15  2:54 PM  Result Value Ref Range Status   MRSA by PCR NEGATIVE NEGATIVE Final    Comment:        The GeneXpert MRSA Assay (FDA approved for NASAL specimens only), is one component of a comprehensive MRSA colonization surveillance program. It is not intended to diagnose  MRSA infection nor to guide or monitor treatment for MRSA infections.   Culture, blood (routine x 2)     Status: None (Preliminary result)   Collection Time: 11/07/15 12:19 AM  Result Value Ref Range Status   Specimen Description BLOOD LEFT HAND  Final   Special Requests BOTTLES DRAWN AEROBIC AND ANAEROBIC 6CC  Final   Culture NO GROWTH 2 DAYS  Final   Report Status PENDING  Incomplete  Culture, blood (routine x 2)     Status: None (Preliminary result)   Collection Time: 11/07/15 12:39 AM  Result Value Ref Range Status   Specimen Description BLOOD LEFT HAND  Final   Special Requests BOTTLES DRAWN AEROBIC AND ANAEROBIC 6CC  Final   Culture NO GROWTH 2 DAYS  Final   Report Status PENDING  Incomplete  Culture, blood (routine x 2)     Status: None (Preliminary result)   Collection Time: 11/07/15  9:18 PM  Result Value Ref Range Status   Specimen Description BLOOD LEFT ASSIST CONTROL  Final   Special Requests BOTTLES DRAWN  AEROBIC AND ANAEROBIC 8ML  Final   Culture NO GROWTH 2 DAYS  Final   Report Status PENDING  Incomplete  Culture, blood (routine x 2)     Status: None (Preliminary result)   Collection Time: 11/07/15  9:27 PM  Result Value Ref Range Status   Specimen Description BLOOD  Final   Special Requests NONE  Final   Culture NO GROWTH 2 DAYS  Final   Report Status PENDING  Incomplete  Urine culture     Status: None   Collection Time: 11/07/15  9:40 PM  Result Value Ref Range Status   Specimen Description URINE, CLEAN CATCH  Final   Special Requests NONE  Final   Culture NO GROWTH 2 DAYS  Final   Report Status 11/09/2015 FINAL  Final    RADIOLOGY:  Dg Chest 2 View  11/08/2015  CLINICAL DATA:  Fevers EXAM: CHEST - 2 VIEW COMPARISON:  11/07/2015 FINDINGS: The heart size and mediastinal contours are within normal limits. Both lungs are clear. The visualized skeletal structures are unremarkable. IMPRESSION: No active disease. Electronically Signed   By: Alcide CleverMark  Lukens M.D.    On: 11/08/2015 09:15        Management plans discussed with the patient, his father and they are in agreement.  CODE STATUS:   TOTAL TIME TAKING CARE OF THIS PATIENT: 33 minutes.    Shaune Pollackhen, Teryl Gubler M.D on 11/09/2015 at 6:01 PM  Between 7am to 6pm - Pager - [phone]  After 6pm go to [email or web address] - password [email or web address] [facility]  [facility]  Office  [phone]  CC: Primary care physician; No primary care provider on file.

## 2015-11-12 LAB — CULTURE, BLOOD (ROUTINE X 2)
CULTURE: NO GROWTH
CULTURE: NO GROWTH
Culture: NO GROWTH
Culture: NO GROWTH

## 2015-12-27 ENCOUNTER — Emergency Department
Admission: EM | Admit: 2015-12-27 | Discharge: 2015-12-27 | Disposition: A | Discharge status: Home / Self Care | Payer: BLUE CROSS/BLUE SHIELD | Attending: Emergency Medicine | Admitting: Emergency Medicine

## 2015-12-27 DIAGNOSIS — S0181XA Laceration without foreign body of other part of head, initial encounter: Secondary | ICD-10-CM

## 2015-12-27 DIAGNOSIS — W228XXA Striking against or struck by other objects, initial encounter: Secondary | ICD-10-CM | POA: Diagnosis not present

## 2015-12-27 DIAGNOSIS — Y998 Other external cause status: Secondary | ICD-10-CM | POA: Insufficient documentation

## 2015-12-27 DIAGNOSIS — S0083XA Contusion of other part of head, initial encounter: Secondary | ICD-10-CM

## 2015-12-27 DIAGNOSIS — Y92310 Basketball court as the place of occurrence of the external cause: Secondary | ICD-10-CM | POA: Insufficient documentation

## 2015-12-27 DIAGNOSIS — S01112A Laceration without foreign body of left eyelid and periocular area, initial encounter: Secondary | ICD-10-CM | POA: Diagnosis present

## 2015-12-27 DIAGNOSIS — Z79899 Other long term (current) drug therapy: Secondary | ICD-10-CM | POA: Diagnosis not present

## 2015-12-27 DIAGNOSIS — Z792 Long term (current) use of antibiotics: Secondary | ICD-10-CM | POA: Diagnosis not present

## 2015-12-27 DIAGNOSIS — Y9367 Activity, basketball: Secondary | ICD-10-CM | POA: Insufficient documentation

## 2015-12-27 MED ORDER — IBUPROFEN 800 MG PO TABS
800.0000 mg | ORAL_TABLET | Freq: Once | ORAL | Status: AC
  Administered 2015-12-27: 800 mg via ORAL
  Filled 2015-12-27: qty 1

## 2015-12-27 MED ORDER — LIDOCAINE HCL (PF) 1 % IJ SOLN
INTRAMUSCULAR | Status: AC
  Administered 2015-12-27: 21:00:00
  Filled 2015-12-27: qty 5

## 2015-12-27 MED ORDER — TRAMADOL HCL 50 MG PO TABS: 50.0000 mg | ORAL_TABLET | Freq: Four times a day (QID) | ORAL | Status: AC | PRN

## 2015-12-27 MED ORDER — IBUPROFEN 800 MG PO TABS: 800.0000 mg | ORAL_TABLET | Freq: Three times a day (TID) | ORAL | Status: AC | PRN

## 2015-12-27 MED ORDER — TRAMADOL HCL 50 MG PO TABS
50.0000 mg | ORAL_TABLET | Freq: Once | ORAL | Status: AC
  Administered 2015-12-27: 50 mg via ORAL
  Filled 2015-12-27: qty 1

## 2015-12-27 NOTE — Discharge Instructions (Signed)
Facial Laceration °A facial laceration is a cut on the face. These injuries can be painful and cause bleeding. Some cuts may need to be closed with stitches (sutures), skin adhesive strips, or wound glue. Cuts usually heal quickly but can leave a scar. It can take 1-2 years for the scar to go away completely. °HOME CARE  °· Only take medicines as told by your doctor. °· Follow your doctor's instructions for wound care. °For Stitches: °· Keep the cut clean and dry. °· If you have a bandage (dressing), change it at least once a day. Change the bandage if it gets wet or dirty, or as told by your doctor. °· Wash the cut with soap and water 2 times a day. Rinse the cut with water. Pat it dry with a clean towel. °· Put a thin layer of medicated cream on the cut as told by your doctor. °· You may shower after the first 24 hours. Do not soak the cut in water until the stitches are removed. °· Have your stitches removed as told by your doctor. °· Do not wear any makeup until a few days after your stitches are removed. °For Skin Adhesive Strips: °· Keep the cut clean and dry. °· Do not get the strips wet. You may take a bath, but be careful to keep the cut dry. °· If the cut gets wet, pat it dry with a clean towel. °· The strips will fall off on their own. Do not remove the strips that are still stuck to the cut. °For Wound Glue: °· You may shower or take baths. Do not soak or scrub the cut. Do not swim. Avoid heavy sweating until the glue falls off on its own. After a shower or bath, pat the cut dry with a clean towel. °· Do not put medicine or makeup on your cut until the glue falls off. °· If you have a bandage, do not put tape over the glue. °· Avoid lots of sunlight or tanning lamps until the glue falls off. °· The glue will fall off on its own in 5-10 days. Do not pick at the glue. °After Healing: °· Put sunscreen on the cut for the first year to reduce your scar. °GET HELP IF: °· You have a fever. °GET HELP RIGHT AWAY  IF:  °· Your cut area gets red, painful, or puffy (swollen). °· You see a yellowish-white fluid (pus) coming from the cut. °  °This information is not intended to replace advice given to you by your health care provider. Make sure you discuss any questions you have with your health care provider. °  °Document Released: 05/28/2008 Document Revised: 12/31/2014 Document Reviewed: 07/23/2013 °Elsevier Interactive Patient Education ©2016 [employer] ° °

## 2015-12-27 NOTE — ED Provider Notes (Signed)
[facility] Emergency Department Provider Note  ____________________________________________  Time seen: Approximately 8:16 PM  I have reviewed the triage vital signs and the nursing notes.   HISTORY  Chief Complaint Laceration    HPI Richard Fleming is a 20 y.o. male facial laceration above the left eyebrow secondary to a contusion from a basketball.Patient denies any LOC. Bleeding is controlled with direct pressure. Patient denies loss of consciousness. Patient denies pain at this time.   History reviewed. No pertinent past medical history.  Patient Active Problem List   Diagnosis Date Noted  . Unresponsive state 11/06/2015  . Alcohol causing toxic effect 11/06/2015    History reviewed. No pertinent past surgical history.  Current Outpatient Rx  Name  Route  Sig  Dispense  Refill  . ACANYA gel   Topical   Apply 1 application topically every morning. Apply sparingly to face.      0     Dispense as written.   Marland Kitchen. amoxicillin-clavulanate (AUGMENTIN) 875-125 MG tablet   Oral   Take 1 tablet by mouth 2 (two) times daily.   20 tablet   0   . Sulfacetamide Sodium, Acne, 10 % LOTN   Topical   Apply 1 application topically at bedtime. Apply to affected area on top of tazorac.      2   . TAZORAC 0.05 % cream   Topical   Apply 1 application topically every evening. Apply to affected area      0     Dispense as written.     Allergies Review of patient's allergies indicates no known allergies.  No family history on file.  Social History Social History  Substance Use Topics  . Smoking status: Never Smoker   . Smokeless tobacco: Never Used  . Alcohol Use: None    Review of Systems Constitutional: No fever/chills Eyes: No visual changes. ENT: No sore throat. Cardiovascular: Denies chest pain. Respiratory: Denies shortness of breath. Gastrointestinal: No abdominal pain.  No nausea, no vomiting.  No diarrhea.  No  constipation. Genitourinary: Negative for dysuria. Musculoskeletal: Negative for back pain. Skin: Negative for rash. Laceration above right eyebrow. Neurological: Negative for headaches, focal weakness or numbness. 10-point ROS otherwise negative.  ____________________________________________   PHYSICAL EXAM:  VITAL SIGNS: ED Triage Vitals  Enc Vitals Group     BP 12/27/15 2011 139/78 mmHg     Pulse Rate 12/27/15 2011 84     Resp 12/27/15 2011 16     Temp 12/27/15 2011 98.4 F (36.9 C)     Temp Source 12/27/15 2011 Oral     SpO2 12/27/15 2011 99 %     Weight 12/27/15 2011 180 lb (81.647 kg)     Height 12/27/15 2011 6' (1.829 m)     Head Cir --      Peak Flow --      Pain Score 12/27/15 2012 0     Pain Loc --      Pain Edu? --      Excl. in GC? --     Constitutional: Alert and oriented. Well appearing and in no acute distress. Eyes: Conjunctivae are normal. PERRL. EOMI. Head: Atraumatic. Nose: No congestion/rhinnorhea. Mouth/Throat: Mucous membranes are moist.  Oropharynx non-erythematous. Neck: No stridor. *No cervical spine tenderness to palpation. Hematological/Lymphatic/Immunilogical: No cervical lymphadenopathy. Cardiovascular: Normal rate, regular rhythm. Grossly normal heart sounds.  Good peripheral circulation. Respiratory: Normal respiratory effort.  No retractions. Lungs CTAB. Gastrointestinal: Soft and nontender. No distention. No abdominal  bruits. No CVA tenderness. Musculoskeletal: No lower extremity tenderness nor edema.  No joint effusions. Neurologic:  Normal speech and language. No gross focal neurologic deficits are appreciated. No gait instability. Skin:  Skin is warm, dry and intact. No rash noted. 0.5 cm laceration above the left eyebrow. Psychiatric: Mood and affect are normal. Speech and behavior are normal.  ____________________________________________   LABS (all labs ordered are listed, but only abnormal results are displayed)  Labs  Reviewed - No data to display ____________________________________________  EKG   ____________________________________________  RADIOLOGY   ____________________________________________   PROCEDURES  Procedure(s) performed: See procedure note  Critical Care performed: No LACERATION REPAIR Performed by: Joni Reining Authorized by: Joni Reining Consent: Verbal consent obtained. Risks and benefits: risks, benefits and alternatives were discussed Consent given by: patient Patient identity confirmed: provided demographic data Prepped and Draped in normal sterile fashion Wound explored  Laceration Location: Left supraorbital area  Laceration Length: 0.5cm  No Foreign Bodies seen or palpated  Anesthesia: local infiltration  Local anesthetic: lidocaine 1% with epinephrine  Anesthetic total:  3ml  Irrigation method: syringe Amount of cleaning: standard  Skin closure: 6-0 proline Number of sutures: 3  Technique: Simple   Patient tolerance: Patient tolerated the procedure well with no immediate complications.  ____________________________________________   INITIAL IMPRESSION / ASSESSMENT AND PLAN / ED COURSE  Pertinent labs & imaging results that were available during my care of the patient were reviewed by me and considered in my medical decision making (see chart for details).  Facial contusion and laceration. Patient  given discharge care instructions. Patient given a prescription for tramadol and advised return back in 5 days for suture removal. ____________________________________________   FINAL CLINICAL IMPRESSION(S) / ED DIAGNOSES  Final diagnoses:  Facial laceration, initial encounter  Facial contusion, initial encounter      Joni Reining, PA-C 12/27/15 2046  Darien Ramus, MD 12/27/15 [phone]

## 2015-12-27 NOTE — ED Notes (Signed)
Pt reports to ED w/ 1" lac to face above L eyebro.  Pt was hit in head while playing bball. Denies LOC

## 2016-01-02 ENCOUNTER — Emergency Department
Admission: EM | Admit: 2016-01-02 | Discharge: 2016-01-02 | Disposition: A | Discharge status: Home / Self Care | Payer: BLUE CROSS/BLUE SHIELD | Attending: Emergency Medicine | Admitting: Emergency Medicine

## 2016-01-02 DIAGNOSIS — Z4802 Encounter for removal of sutures: Secondary | ICD-10-CM

## 2016-01-02 DIAGNOSIS — Z792 Long term (current) use of antibiotics: Secondary | ICD-10-CM | POA: Diagnosis not present

## 2016-01-02 DIAGNOSIS — Z79899 Other long term (current) drug therapy: Secondary | ICD-10-CM | POA: Insufficient documentation

## 2016-01-02 NOTE — Discharge Instructions (Signed)

## 2016-01-02 NOTE — ED Notes (Signed)
Suture removal

## 2016-01-02 NOTE — ED Provider Notes (Signed)
[facility] Emergency Department Provider Note ____________________________________________  Time seen: Approximately 1:35 PM  I have reviewed the triage vital signs and the nursing notes.   HISTORY  Chief Complaint Suture / Staple Removal   HPI Richard Fleming is a 20 y.o. male is here for suture removal. Patient was seen on 12/27/2015 for laceration of his left eyebrow. He denies any continued problems or pain. He has not had any fever.   No past medical history on file.  Patient Active Problem List   Diagnosis Date Noted  . Unresponsive state 11/06/2015  . Alcohol causing toxic effect 11/06/2015    No past surgical history on file.  Current Outpatient Rx  Name  Route  Sig  Dispense  Refill  . ACANYA gel   Topical   Apply 1 application topically every morning. Apply sparingly to face.      0     Dispense as written.   Marland Kitchen amoxicillin-clavulanate (AUGMENTIN) 875-125 MG tablet   Oral   Take 1 tablet by mouth 2 (two) times daily.   20 tablet   0   . ibuprofen (ADVIL,MOTRIN) 800 MG tablet   Oral   Take 1 tablet (800 mg total) by mouth every 8 (eight) hours as needed for moderate pain.   15 tablet   0   . Sulfacetamide Sodium, Acne, 10 % LOTN   Topical   Apply 1 application topically at bedtime. Apply to affected area on top of tazorac.      2   . TAZORAC 0.05 % cream   Topical   Apply 1 application topically every evening. Apply to affected area      0     Dispense as written.   . traMADol (ULTRAM) 50 MG tablet   Oral   Take 1 tablet (50 mg total) by mouth every 6 (six) hours as needed for moderate pain.   12 tablet   0     Allergies Review of patient's allergies indicates no known allergies.  No family history on file.  Social History Social History  Substance Use Topics  . Smoking status: Never Smoker   . Smokeless tobacco: Never Used  . Alcohol Use: Not on file    Review of Systems Constitutional: No  fever/chills Eyes: No visual changes. Musculoskeletal: Negative for back pain. Skin: Laceration left eyebrow. Neurological: Negative for headaches  10-point ROS otherwise negative.  ____________________________________________   PHYSICAL EXAM:  VITAL SIGNS: ED Triage Vitals  Enc Vitals Group     BP --      Pulse --      Resp --      Temp 01/02/16 1306 98.1 F (36.7 C)     Temp Source 01/02/16 1306 Oral     SpO2 --      Weight --      Height --      Head Cir --      Peak Flow --      Pain Score --      Pain Loc --      Pain Edu? --      Excl. in GC? --     Constitutional: Alert and oriented. Well appearing and in no acute distress. Eyes: Conjunctivae are normal. PERRL. EOMI. Head: Atraumatic. Nose: No congestion/rhinnorhea. Neck: No stridor.   Respiratory: Normal respiratory effort. Skin:  Skin is warm, dry and intact. Well healed laceration without a signs of infection. Psychiatric: Mood and affect are normal. Speech and behavior  are normal.  ____________________________________________   LABS (all labs ordered are listed, but only abnormal results are displayed)  Labs Reviewed - No data to display  PROCEDURES  Procedure(s) performed: None  Critical Care performed: No  ____________________________________________   INITIAL IMPRESSION / ASSESSMENT AND PLAN / ED COURSE  Pertinent labs & imaging results that were available during my care of the patient were reviewed by me and considered in my medical decision making (see chart for details).   Sutures were removed by the RN and patient was told to return if any problems. ____________________________________________   FINAL CLINICAL IMPRESSION(S) / ED DIAGNOSES  Final diagnoses:  Encounter for removal of sutures      Tommi RumpsRhonda L Summers, PA-C 01/02/16 1527  Jene Everyobert Kinner, MD 01/02/16 [phone]

## 2017-05-28 IMAGING — CT CT HEAD W/O CM
4 series · 19 of 30 positions shown, 20 images · non-contrast
Comparison: None.

CLINICAL DATA: Dropped off at dorm this morning after night of
alcohol use and possible drugs. Given narcan. Pinpoint pupils as
patient unresponsive and week gag reflex.

EXAM:
CT HEAD WITHOUT CONTRAST
TECHNIQUE: Contiguous axial images were obtained from the base of the skull
through the vertex without intravenous contrast.

[Series 2: head bone · axial · 0.43mm/px · z∈[-196,-46]mm · 8 of 93 slices shown]
[im 9/93  bone]
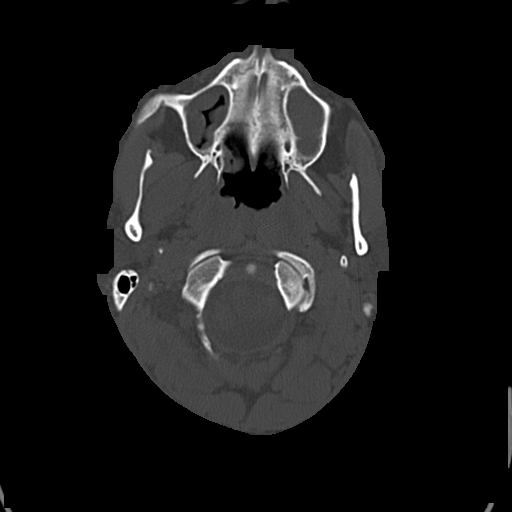
[im 17/93  bone]
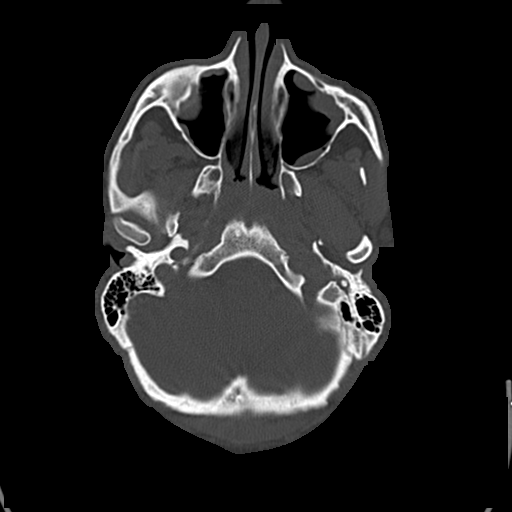
[im 34/93  bone]
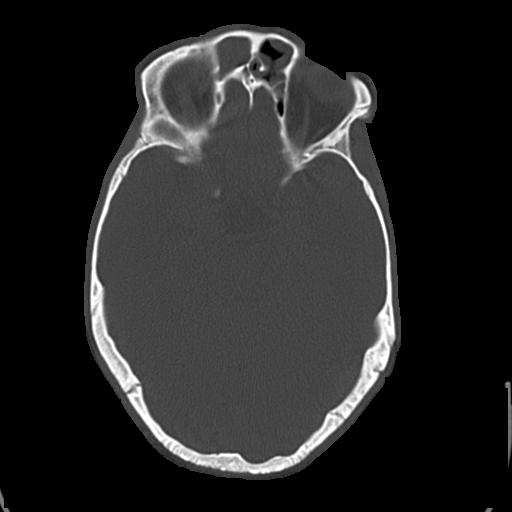
[im 42/93  bone]
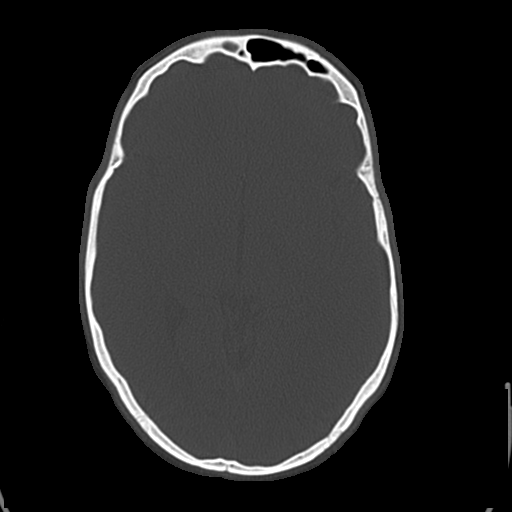
[im 51/93  bone]
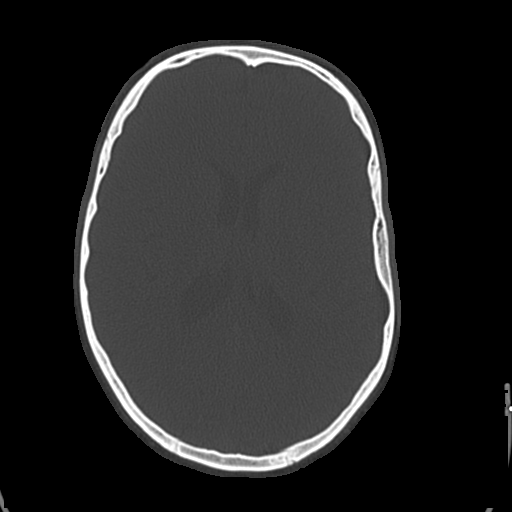
[im 59/93  bone]
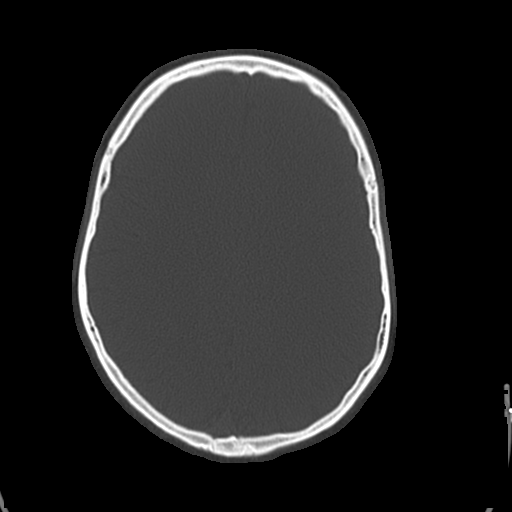
[im 76/93  bone]
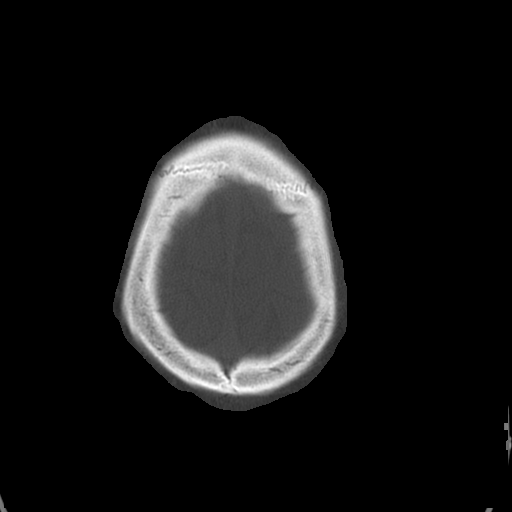
[im 84/93  bone]
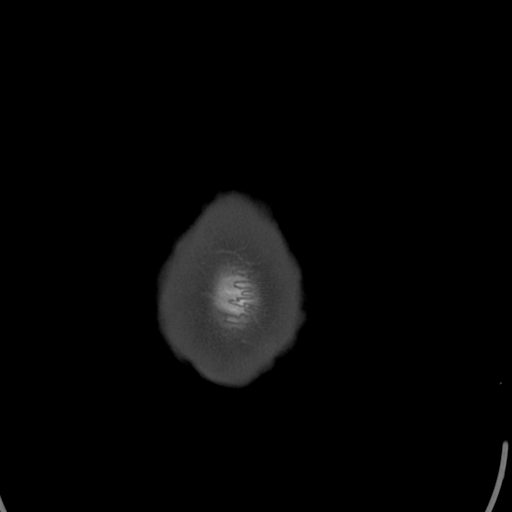

[Series 3: head wo · axial · 0.43mm/px · z∈[-148,-93]mm · 2 of 34 slices shown, 3 images]
[im 12/34  brain]
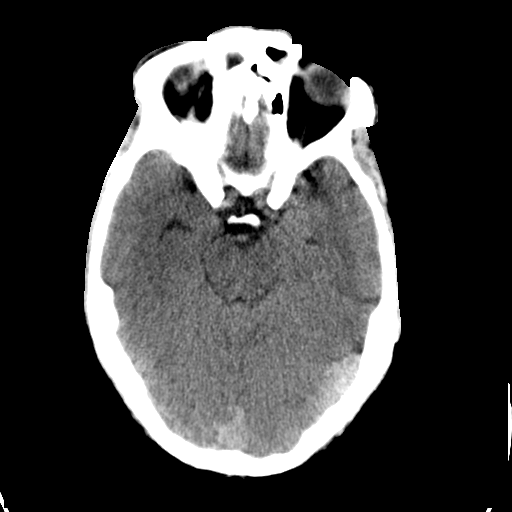
[im 12/34  bone]
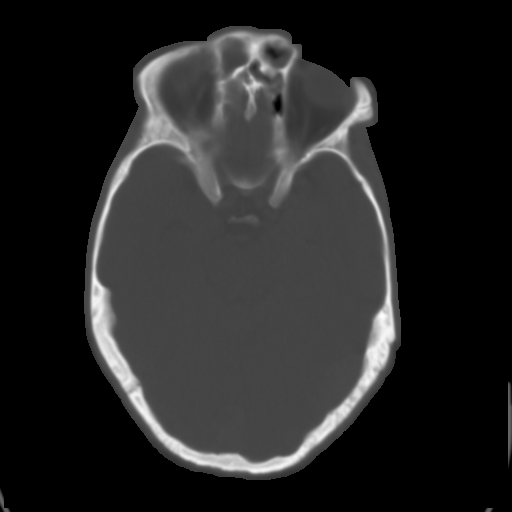
[im 23/34  brain]
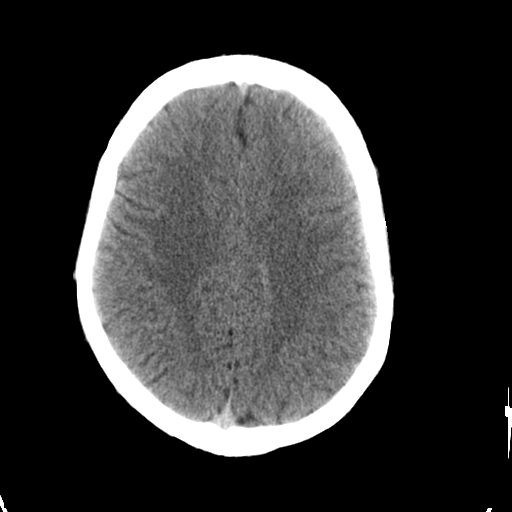

[Series 4: head wo recon · axial · 0.43mm/px · z∈[-93,-47]mm · 2 of 30 slices shown]
[im 10/30  brain]
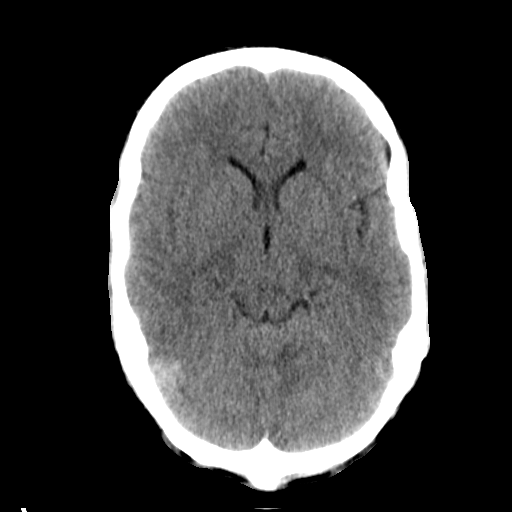
[im 20/30  brain]
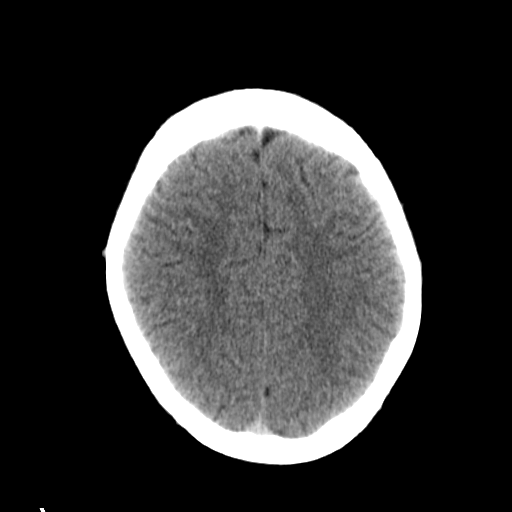

[Series 5: head bone recon · axial · 0.43mm/px · z∈[-128,-33]mm · 7 of 79 slices shown]
[im 9/79  bone]
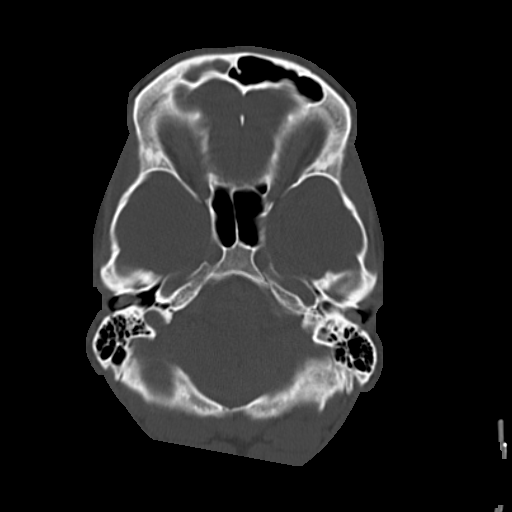
[im 18/79  bone]
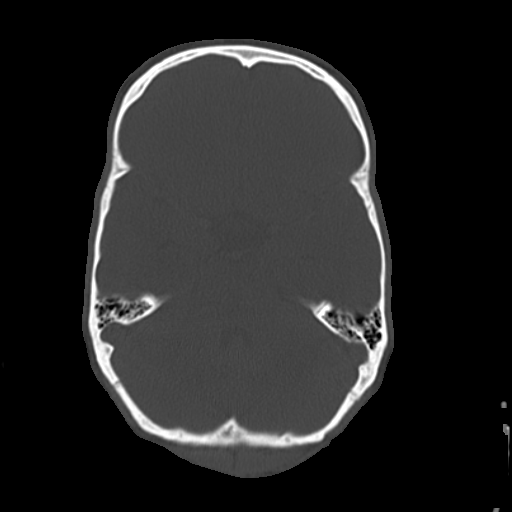
[im 27/79  bone]
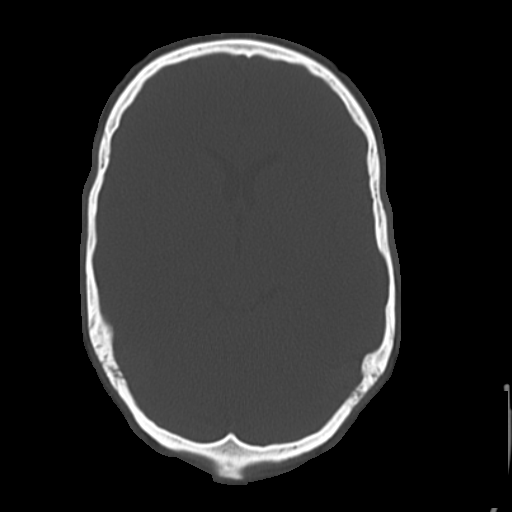
[im 35/79  bone]
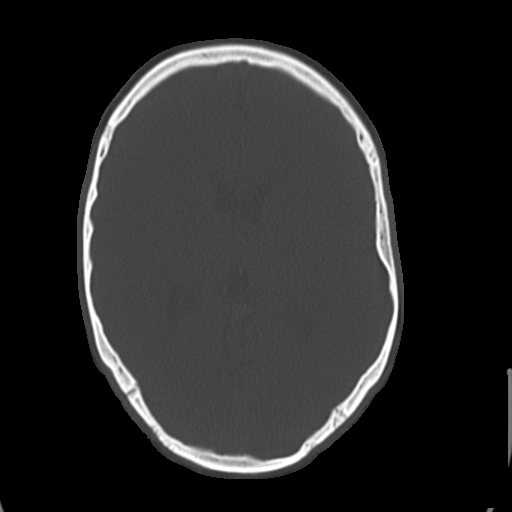
[im 44/79  bone]
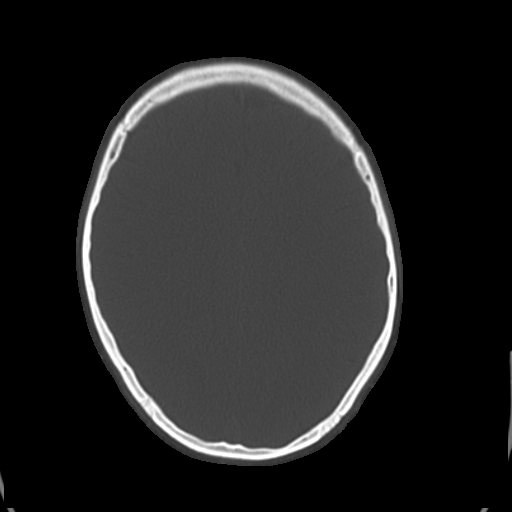
[im 53/79  bone]
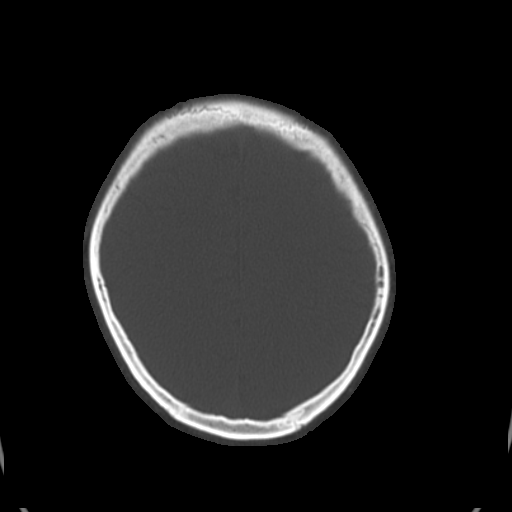
[im 61/79  bone]
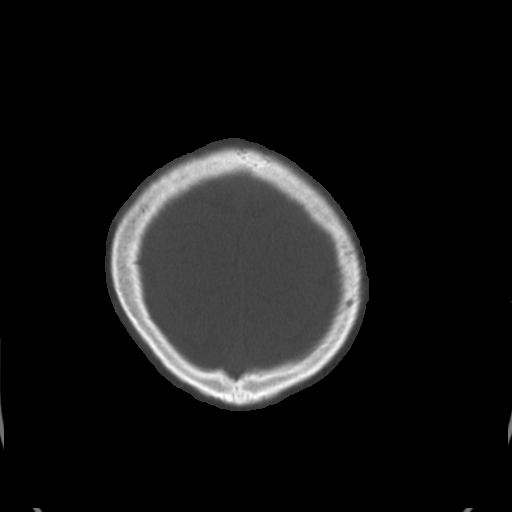

[19 of 30 positions shown; findings below may reference images not displayed]

FINDINGS: Ventricles and cisterns are within normal. There is no mass, shift
of midline structures or acute hemorrhage. No evidence of acute
infarction. There is moderate chronic sinus inflammatory disease.
Bony structures are within normal.
IMPRESSION: No acute intracranial findings.

## 2017-05-29 IMAGING — CR DG CHEST 1V PORT
1 series · 1 of 1 positions shown · non-contrast
Comparison: Portable chest x-ray November 06, 2015

CLINICAL DATA: Respiratory failure

EXAM:
PORTABLE CHEST 1 VIEW

[portable]
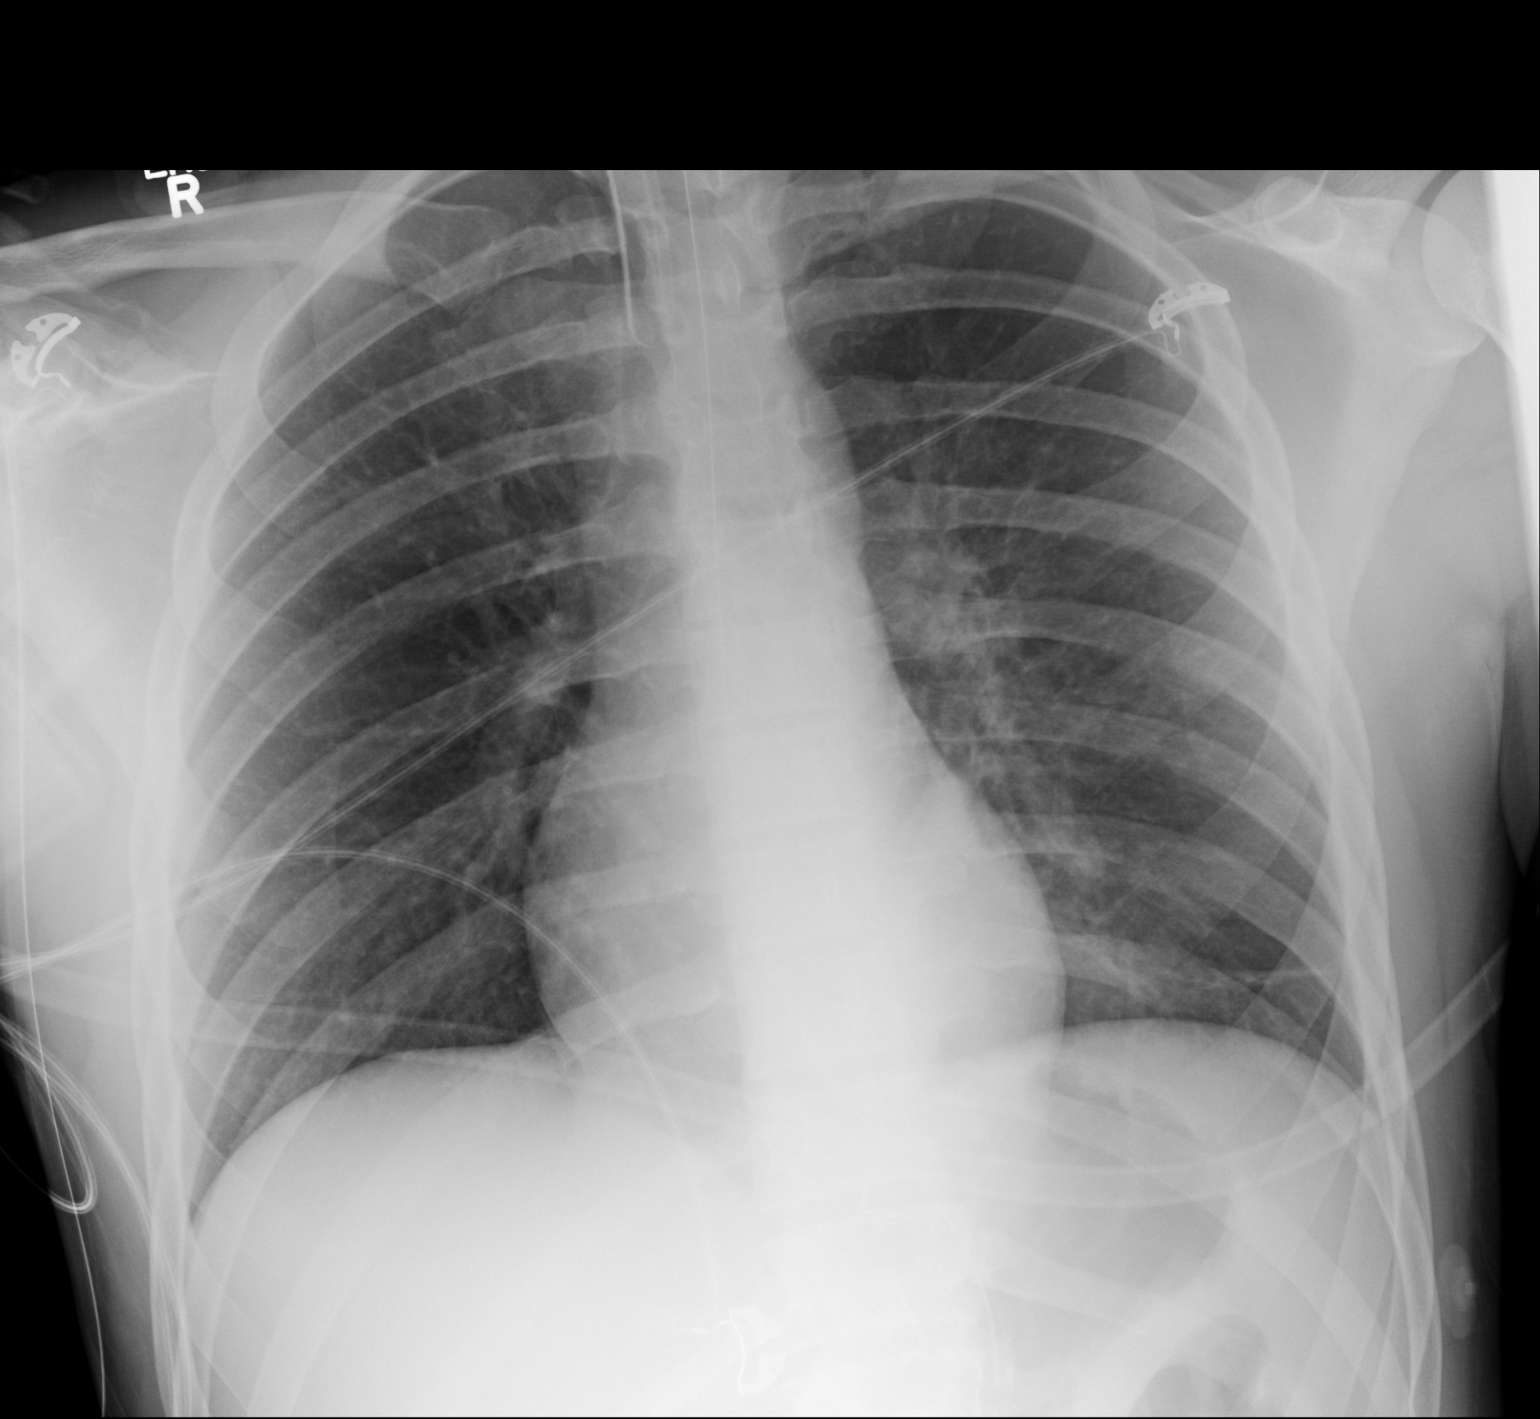

[1 of 1 positions shown; findings below may reference images not displayed]

FINDINGS: The lungs are well-expanded. Patchy interstitial infiltrate persists
in the right lower lobe. There is no pleural effusion or
pneumothorax. The heart and pulmonary vascularity are normal. The
endotracheal tube tip projects 3.6 cm above the carina. The
esophagogastric tube tip projects below the inferior margin of the
image.
IMPRESSION: Left lower lobe subsegmental atelectasis and more conspicuous today.
The examination is otherwise unremarkable. The endotracheal tube has
been advanced since yesterday's study.
